# Patient Record
Sex: Female | Born: 1935 | Race: White | Hispanic: No | Smoking: Never smoker
Health system: Southern US, Community
[De-identification: ages and names within clinical notes are randomized; demographics above are authoritative.]

## PROBLEM LIST (undated history)

## (undated) DIAGNOSIS — E782 Mixed hyperlipidemia: Secondary | ICD-10-CM

## (undated) DIAGNOSIS — F339 Major depressive disorder, recurrent, unspecified: Secondary | ICD-10-CM

## (undated) DIAGNOSIS — Z79899 Other long term (current) drug therapy: Secondary | ICD-10-CM

## (undated) DIAGNOSIS — I739 Peripheral vascular disease, unspecified: Secondary | ICD-10-CM

## (undated) DIAGNOSIS — I059 Rheumatic mitral valve disease, unspecified: Secondary | ICD-10-CM

## (undated) DIAGNOSIS — M5136 Other intervertebral disc degeneration, lumbar region: Secondary | ICD-10-CM

## (undated) DIAGNOSIS — R5383 Other fatigue: Secondary | ICD-10-CM

## (undated) DIAGNOSIS — I10 Essential (primary) hypertension: Secondary | ICD-10-CM

## (undated) DIAGNOSIS — F419 Anxiety disorder, unspecified: Secondary | ICD-10-CM

## (undated) DIAGNOSIS — R0600 Dyspnea, unspecified: Secondary | ICD-10-CM

## (undated) DIAGNOSIS — K589 Irritable bowel syndrome without diarrhea: Secondary | ICD-10-CM

## (undated) DIAGNOSIS — K219 Gastro-esophageal reflux disease without esophagitis: Secondary | ICD-10-CM

## (undated) DIAGNOSIS — Z853 Personal history of malignant neoplasm of breast: Secondary | ICD-10-CM

## (undated) DIAGNOSIS — R011 Cardiac murmur, unspecified: Secondary | ICD-10-CM

## (undated) DIAGNOSIS — M8580 Other specified disorders of bone density and structure, unspecified site: Secondary | ICD-10-CM

## (undated) DIAGNOSIS — M479 Spondylosis, unspecified: Secondary | ICD-10-CM

## (undated) DIAGNOSIS — R5381 Other malaise: Secondary | ICD-10-CM

## (undated) DIAGNOSIS — R7303 Prediabetes: Secondary | ICD-10-CM

## (undated) DIAGNOSIS — C50919 Malignant neoplasm of unspecified site of unspecified female breast: Secondary | ICD-10-CM

## (undated) DIAGNOSIS — I6523 Occlusion and stenosis of bilateral carotid arteries: Secondary | ICD-10-CM

## (undated) DIAGNOSIS — M150 Primary generalized (osteo)arthritis: Secondary | ICD-10-CM

## (undated) HISTORY — DX: Anxiety disorder, unspecified: F41.9

## (undated) HISTORY — DX: Occlusion and stenosis of bilateral carotid arteries: I65.23

## (undated) HISTORY — DX: Spondylosis, unspecified: M47.9

## (undated) HISTORY — DX: Prediabetes: R73.03

## (undated) HISTORY — DX: Other long term (current) drug therapy: Z79.899

## (undated) HISTORY — DX: Malignant neoplasm of unspecified site of unspecified female breast: C50.919

## (undated) HISTORY — PX: MASTECTOMY: SHX3

## (undated) HISTORY — DX: Peripheral vascular disease, unspecified: I73.9

## (undated) HISTORY — DX: Irritable bowel syndrome without diarrhea: K58.9

## (undated) HISTORY — DX: Personal history of malignant neoplasm of breast: Z85.3

## (undated) HISTORY — DX: Mixed hyperlipidemia: E78.2

## (undated) HISTORY — DX: Gastro-esophageal reflux disease without esophagitis: K21.9

## (undated) HISTORY — DX: Essential (primary) hypertension: I10

## (undated) HISTORY — DX: Other fatigue: R53.83

## (undated) HISTORY — DX: Other malaise: R53.81

## (undated) HISTORY — DX: Primary generalized (osteo)arthritis: M15.0

## (undated) HISTORY — DX: Rheumatic mitral valve disease, unspecified: I05.9

## (undated) HISTORY — DX: Other specified disorders of bone density and structure, unspecified site: M85.80

## (undated) HISTORY — DX: Other intervertebral disc degeneration, lumbar region: M51.36

## (undated) HISTORY — DX: Major depressive disorder, recurrent, unspecified: F33.9

---

## 2013-08-15 ENCOUNTER — Ambulatory Visit: Payer: Self-pay | Admitting: Ophthalmology

## 2013-08-15 DIAGNOSIS — I10 Essential (primary) hypertension: Secondary | ICD-10-CM

## 2013-08-15 LAB — POTASSIUM: POTASSIUM: 3.4 mmol/L — AB (ref 3.5–5.1)

## 2013-08-23 ENCOUNTER — Ambulatory Visit: Payer: Self-pay | Admitting: Ophthalmology

## 2013-09-08 ENCOUNTER — Ambulatory Visit: Payer: Self-pay | Admitting: Ophthalmology

## 2013-09-08 LAB — POTASSIUM: Potassium: 3.8 mmol/L (ref 3.5–5.1)

## 2013-09-20 ENCOUNTER — Ambulatory Visit: Payer: Self-pay | Admitting: Ophthalmology

## 2014-12-01 NOTE — Op Note (Signed)
PATIENT NAME:  Susan Spencer, Susan Spencer MR#:  [id number] DATE OF BIRTH:  October 04, 1929  DATE OF PROCEDURE:  09/20/2013  PREOPERATIVE DIAGNOSIS:  Senile cataract of the left eye.  POSTOPERATIVE DIAGNOSIS:  Senile cataract of the left eye.  PROCEDURE:  Phacoemulsification with posterior chamber intraocular lens placement of the left eye.   LENS:  ZCT150, 21.0-diopter posterior chamber toric intraocular lens with 1.5 diopters of cylindrical placed at axis 11 degrees.  ULTRASOUND TIME:  20% of 58 seconds.  CDE 11.7  SURGEON:  Italyhad Cris Talavera, MD  ANESTHESIA:  Topical with tetracaine drops and 2% Xylocaine jelly.  COMPLICATIONS:  None.  DESCRIPTION OF PROCEDURE:  The patient was identified in the holding room and transported to the operating suite and placed in upright position.  The eye was marked so that the 0, 90 and 180 degree axis were clearly marked with purple ink.  Xylocaine gel was placed into the eye and then the eye was prepped and draped in the usual sterile ophthalmic fashion.  The operating microscope was placed into position.   A 1 mm clear-corneal paracentesis incision was made at the 1:30 position.  The anterior chamber was filled with Viscoat.  A 2.4 mm near clear corneal incision was then made at the 10:30 position.  A cystotome and capsulorrhexis forceps were then used to make a curvilinear capsulorrhexis.  Hydrodissect and hydrodelineation were then performed using balanced salt solution.   Phacoemulsification was then used in stop and chop fashion to remove the lens, nucleus and epinucleus.  The remaining cortex was aspirated using the irrigation and aspiration handpiece.  Provisc viscoelastic was then placed into the capsular bag to distend it for lens placement.  An axis marker was then dipped in purple ink and used to mark the axis position of 11 degrees for placement of the toric intraocular lens.  The 11 degree axis has been previously determined by the toric lens calculator.   A  ZCT150, 21.0-diopter lens was then injected into the capsular bag.  It was rotated clockwise until the axis marks on the lens were approximately 15 degrees in the counterclockwise direction to the 11 degree axis mark on the cornea.  The viscoelastic was aspirated from the eye using the irrigation aspiration handpiece.  Then, a Sinskey hook through the sideport incision was used to rotate the lens in a clockwise direction until the axis markings of the intraocular lens were lined up with the axis markings on the cornea.  Balanced salt solution was then used to hydrate the wounds.   Miostat was placed into the anterior chamber to constrict the pupil.  0.1 mL of cefuroxime 10 mg/mL were injected into the anterior chamber for a dose of 1 mg of intracameral antibiotic at the completion of the case. The eye was noted to have a physiologic pressure and there was no wound leak noted.  Topical Vigamox drops and erythromycin ointment were applied to the eye.  The patient was taken to the recovery room in stable condition having had no  complications of anesthesia or surgery.    ____________________________ Deirdre Evenerhadwick R. Taylor Levick, MD crb:dp D: 09/20/2013 14:45:06 ET T: 09/20/2013 15:28:27 ET JOB#: [id number]  cc: Deirdre Evenerhadwick R. Amit Meloy, MD, <Dictator> Lockie MolaHADWICK Pricila Bridge MD ELECTRONICALLY SIGNED 09/27/2013 12:12

## 2014-12-01 NOTE — Op Note (Signed)
PATIENT NAME:  Susan Spencer, Susan Spencer MR#:  [id number] DATE OF BIRTH:  [birth year]  DATE OF PROCEDURE:  08/23/2013  PREOPERATIVE DIAGNOSIS:  Senile cataract of the right eye.  POSTOPERATIVE DIAGNOSIS:  Senile cataract of the right eye.  PROCEDURE:  Phacoemulsification with posterior chamber intraocular lens placement of the right eye.   LENS:  ZCT225 21.0-diopter posterior chamber Toric interocular lens with 2.25 diopters of cylindrical power with axis orientation at 169 degrees.  ULTRASOUND TIME:  14 % of 1 minute 49 seconds.  CDE 15.5.  SURGEON:  Italyhad Paris Chiriboga, MD  ANESTHESIA:  Topical with tetracaine drops and 2% Xylocaine jelly.  COMPLICATIONS:  None.  DESCRIPTION OF PROCEDURE:  The patient was identified in the holding room and transported to the operating suite and placed in upright position.  The eye was marked so that the 0, 90 and 180 degree axis were clearly marked with purple ink.  Xylocaine gel was placed into the eye and then the eye was prepped and draped in the usual sterile ophthalmic fashion.  The operating microscope was placed into position.   A 1 mm clear-corneal paracentesis incision was made at the 12 o'clock position.  The anterior chamber was filled with Viscoat.  A 2.4-millimeter near clear corneal incision was then made at the 9 o'clock position.  A cystotome and capsulorrhexis forceps were then used to make a curvilinear capsulorrhexis.  Hydrodissect and hydrodelineation were then performed using balanced salt solution.   Phacoemulsification was then used in stop and chop fashion to remove the lens, nucleus and epinucleus.  The remaining cortex was aspirated using the irrigation and aspiration handpiece.  Provisc viscoelastic was then placed into the capsular bag to distend it for lens placement.  An axis marker was then dipped in purple ink and used to mark the axis position of 169 degrees for placement of the toric intraocular lens.  The 169 degree axis has been previously  determined by the toric lens calculator.   A 21.0-diopter lens was then injected into the capsular bag.  It was rotated clockwise until the axis marks on the lens were approximately 15 degrees in the counterclockwise direction to the 169 degree axis mark on the cornea.  The viscoelastic was aspirated from the eye using the irrigation aspiration handpiece.  Then, a Sinskey hook through the sideport incision was used to rotate the lens in a clockwise direction until the axis markings of the intraocular lens were lined up with the axis markings on the cornea.  Balanced salt solution was then used to hydrate the wounds.   Miostat was placed into the anterior chamber to constrict the pupil.  0.1 mL of cefuroxime 10 mg/mL were injected into the anterior chamber for a dose of 1 mg of intracameral antibiotic at the completion of the case. The eye was noted to have a physiologic pressure and there was no wound leak noted.  Topical Vigamox drops and erythromycin ointment were applied to the eye.  The patient was taken to the recovery room in stable condition having had no  complications of anesthesia or surgery.  ____________________________ Deirdre Evenerhadwick R. Shanece Cochrane, MD crb:sg D: 08/23/2013 12:57:21 ET T: 08/23/2013 13:15:19 ET JOB#: [id number]  cc: Deirdre Evenerhadwick R. Jadakiss Barish, MD, <Dictator> Lockie MolaHADWICK Graylon Amory MD ELECTRONICALLY SIGNED 08/30/2013 14:45

## 2015-06-18 DIAGNOSIS — I110 Hypertensive heart disease with heart failure: Secondary | ICD-10-CM

## 2015-06-18 DIAGNOSIS — I10 Essential (primary) hypertension: Secondary | ICD-10-CM

## 2015-06-18 HISTORY — DX: Hypertensive heart disease with heart failure: I11.0

## 2015-06-18 HISTORY — DX: Essential (primary) hypertension: I10

## 2015-10-28 DIAGNOSIS — K589 Irritable bowel syndrome without diarrhea: Secondary | ICD-10-CM

## 2015-10-28 DIAGNOSIS — K219 Gastro-esophageal reflux disease without esophagitis: Secondary | ICD-10-CM

## 2015-10-28 DIAGNOSIS — I6523 Occlusion and stenosis of bilateral carotid arteries: Secondary | ICD-10-CM

## 2015-10-28 DIAGNOSIS — M5136 Other intervertebral disc degeneration, lumbar region: Secondary | ICD-10-CM

## 2015-10-28 DIAGNOSIS — Z79899 Other long term (current) drug therapy: Secondary | ICD-10-CM

## 2015-10-28 DIAGNOSIS — M159 Polyosteoarthritis, unspecified: Secondary | ICD-10-CM

## 2015-10-28 DIAGNOSIS — F339 Major depressive disorder, recurrent, unspecified: Secondary | ICD-10-CM | POA: Insufficient documentation

## 2015-10-28 DIAGNOSIS — M8580 Other specified disorders of bone density and structure, unspecified site: Secondary | ICD-10-CM

## 2015-10-28 DIAGNOSIS — M8949 Other hypertrophic osteoarthropathy, multiple sites: Secondary | ICD-10-CM

## 2015-10-28 DIAGNOSIS — M479 Spondylosis, unspecified: Secondary | ICD-10-CM

## 2015-10-28 DIAGNOSIS — I059 Rheumatic mitral valve disease, unspecified: Secondary | ICD-10-CM

## 2015-10-28 DIAGNOSIS — M150 Primary generalized (osteo)arthritis: Secondary | ICD-10-CM

## 2015-10-28 DIAGNOSIS — M51369 Other intervertebral disc degeneration, lumbar region without mention of lumbar back pain or lower extremity pain: Secondary | ICD-10-CM

## 2015-10-28 DIAGNOSIS — E782 Mixed hyperlipidemia: Secondary | ICD-10-CM

## 2015-10-28 DIAGNOSIS — F419 Anxiety disorder, unspecified: Secondary | ICD-10-CM | POA: Insufficient documentation

## 2015-10-28 DIAGNOSIS — R7303 Prediabetes: Secondary | ICD-10-CM

## 2015-10-28 HISTORY — DX: Spondylosis, unspecified: M47.9

## 2015-10-28 HISTORY — DX: Polyosteoarthritis, unspecified: M15.9

## 2015-10-28 HISTORY — DX: Mixed hyperlipidemia: E78.2

## 2015-10-28 HISTORY — DX: Occlusion and stenosis of bilateral carotid arteries: I65.23

## 2015-10-28 HISTORY — DX: Other specified disorders of bone density and structure, unspecified site: M85.80

## 2015-10-28 HISTORY — DX: Primary generalized (osteo)arthritis: M15.0

## 2015-10-28 HISTORY — DX: Other intervertebral disc degeneration, lumbar region without mention of lumbar back pain or lower extremity pain: M51.369

## 2015-10-28 HISTORY — DX: Gastro-esophageal reflux disease without esophagitis: K21.9

## 2015-10-28 HISTORY — DX: Major depressive disorder, recurrent, unspecified: F33.9

## 2015-10-28 HISTORY — DX: Other long term (current) drug therapy: Z79.899

## 2015-10-28 HISTORY — DX: Prediabetes: R73.03

## 2015-10-28 HISTORY — DX: Other intervertebral disc degeneration, lumbar region: M51.36

## 2015-10-28 HISTORY — DX: Anxiety disorder, unspecified: F41.9

## 2015-10-28 HISTORY — DX: Other hypertrophic osteoarthropathy, multiple sites: M89.49

## 2015-10-28 HISTORY — DX: Rheumatic mitral valve disease, unspecified: I05.9

## 2015-10-28 HISTORY — DX: Irritable bowel syndrome, unspecified: K58.9

## 2015-10-29 DIAGNOSIS — I739 Peripheral vascular disease, unspecified: Secondary | ICD-10-CM

## 2015-10-29 HISTORY — DX: Peripheral vascular disease, unspecified: I73.9

## 2016-05-05 DIAGNOSIS — Z853 Personal history of malignant neoplasm of breast: Secondary | ICD-10-CM

## 2016-05-05 DIAGNOSIS — R5383 Other fatigue: Secondary | ICD-10-CM

## 2016-05-05 DIAGNOSIS — R5381 Other malaise: Secondary | ICD-10-CM

## 2016-05-05 HISTORY — DX: Personal history of malignant neoplasm of breast: Z85.3

## 2016-05-05 HISTORY — DX: Other malaise: R53.81

## 2017-07-12 ENCOUNTER — Other Ambulatory Visit: Payer: Self-pay

## 2017-07-12 DIAGNOSIS — I6523 Occlusion and stenosis of bilateral carotid arteries: Secondary | ICD-10-CM

## 2017-07-12 HISTORY — DX: Occlusion and stenosis of bilateral carotid arteries: I65.23

## 2017-07-12 MED ORDER — SPIRONOLACTONE-HCTZ 25-25 MG PO TABS: 0.5000 | ORAL_TABLET | Freq: Every day | ORAL | 0 refills | Status: DC

## 2017-08-16 NOTE — Progress Notes (Signed)
Cardiology Office Note:    Date:  08/17/2017   ID:  Susan Spencer, DOB [birth year], MRN [id number]  PCP:  Gordan Payment., MD  Cardiologist:  Norman Herrlich, MD    Referring MD: Gordan Payment., MD    ASSESSMENT:    1. Hypertensive heart disease with heart failure (HCC)   2. Mixed hyperlipidemia    PLAN:    In order of problems listed above:  1. Stable blood pressure at target no evidence of fluid overload continue current medical treatment with combined thiazide and distal diuretic and low-dose beta-blocker.  Recent labs show normal potassium and GFR 2. Stable continue current low intensity statin recent lipids show no evidence of liver toxicity and good LDL outcome.   Next appointment: One year   Medication Adjustments/Labs and Tests Ordered: Current medicines are reviewed at length with the patient today.  Concerns regarding medicines are outlined above.  Orders Placed This Encounter  Procedures  . EKG 12-Lead   No orders of the defined types were placed in this encounter.   Chief Complaint  Patient presents with  . Follow-up  . Hypertension  . Congestive Heart Failure    History of Present Illness:    Susan Spencer is a 84 y.o. female with a hx of CHF, HTN  last seen in December 2017. Compliance with diet, lifestyle and medications: Yes She has done well without shortness of breath edema palpitation chest pain syncope or TIA.  She had a carotid duplex in my previous practice December 2017 I requested a copy of that report.  As she has not had TIA and is on aspirin I have not ordered a repeat duplex at this time Past Medical History:  Diagnosis Date  . Anxiety disorder 10/28/2015  . Carotid occlusion, bilateral 07/12/2017   left distal ICA is tortuous, increased velocity is seen    . Degeneration of lumbar intervertebral disc 10/28/2015  . Depression, major, recurrent (HCC) 10/28/2015  . Essential hypertension 06/18/2015  . GERD (gastroesophageal reflux disease)  10/28/2015  . High risk medication use 10/28/2015  . History of breast cancer 05/05/2016  . IBS (irritable bowel syndrome) 10/28/2015  . Malaise and fatigue 05/05/2016  . Mitral valve disease 10/28/2015   Overview:  Mild mitral regurg.  . Mixed hyperlipidemia 10/28/2015  . Occlusion and stenosis of bilateral carotid arteries 10/28/2015  . Osteopenia 10/28/2015  . Prediabetes 10/28/2015  . Primary osteoarthritis involving multiple joints 10/28/2015  . PVD (peripheral vascular disease) (HCC) 10/29/2015   Overview:  Carotid.  50-69 Lreft 03/2014. [place]  . Spondylosis 10/28/2015    Past Surgical History:  Procedure Laterality Date  . MASTECTOMY      Current Medications: Current Meds  Medication Sig  . aspirin EC 81 MG tablet Take 1 tablet by mouth daily.  . DOCOSAHEXAENOIC ACID PO Take 1,000 mg by mouth daily.  . dorzolamide-timolol (COSOPT) 22.3-6.8 MG/ML ophthalmic solution Place 1 drop into both eyes 2 (two) times daily.  . fluticasone (FLONASE) 50 MCG/ACT nasal spray Place 1-2 sprays into both nostrils daily.  Marland Kitchen LUMIGAN 0.01 % SOLN Place 1 drop into both eyes daily.  . metoprolol succinate (TOPROL-XL) 50 MG 24 hr tablet Take 1 tablet by mouth daily.  . Multiple Vitamin (MULTI-VITAMINS) TABS Take 1 tablet by mouth daily.  . pravastatin (PRAVACHOL) 20 MG tablet Take 1 tablet by mouth daily.  . raloxifene (EVISTA) 60 MG tablet Take 1 tablet by mouth daily.  . ranitidine (ZANTAC) 150 MG capsule Take  150 mg by mouth 2 (two) times daily.  . sertraline (ZOLOFT) 50 MG tablet Take 1 tablet by mouth daily.  Marland Kitchen. spironolactone-hydrochlorothiazide (ALDACTAZIDE) 25-25 MG tablet Take 0.5 tablets by mouth daily.  . traZODone (DESYREL) 50 MG tablet Take 25 mg by mouth as directed.     Allergies:   Patient has no known allergies.   Social History   Socioeconomic History  . Marital status: Married    Spouse name: None  . Number of children: None  . Years of education: None  . Highest education level:  None  Social Needs  . Financial resource strain: None  . Food insecurity - worry: None  . Food insecurity - inability: None  . Transportation needs - medical: None  . Transportation needs - non-medical: None  Occupational History  . None  Tobacco Use  . Smoking status: Never Smoker  . Smokeless tobacco: Never Used  Substance and Sexual Activity  . Alcohol use: No    Frequency: Never  . Drug use: No  . Sexual activity: None  Other Topics Concern  . None  Social History Narrative  . None     Family History: The patient's family history includes Diabetes in her mother. ROS:   Please see the history of present illness.    All other systems reviewed and are negative.  EKGs/Labs/Other Studies Reviewed:    The following studies were reviewed today:  EKG:  EKG ordered today.  The ekg ordered today demonstrates SRTH first degree AVB stable pattern qs in v2  Recent Labs: 05/11/17 CMP normal Cr 0.77 K 4.0 CBC normal  Recent Lipid Panel 05/11/17 Chol 149, LDL 96 HDL 52   Physical Exam:    VS:  BP 132/68   Pulse 68   Ht 5\' 5"  (1.651 m)   Wt 128 lb 6.4 oz (58.2 kg)   SpO2 98%   BMI 21.37 kg/m     Wt Readings from Last 3 Encounters:  08/17/17 128 lb 6.4 oz (58.2 kg)     GEN: looks younger than age Well nourished, well developed in no acute distress HEENT: Normal NECK: No JVD; bilateral soft systolic bruits LYMPHATICS: No lymphadenopathy CARDIAC: RRR, 1/6 SEM aortic area S2 is normal  RESPIRATORY:  Clear to auscultation without rales, wheezing or rhonchi  ABDOMEN: Soft, non-tender, non-distended MUSCULOSKELETAL:  No edema; No deformity  SKIN: Warm and dry NEUROLOGIC:  Alert and oriented x 3 PSYCHIATRIC:  Normal affect    Signed, Norman HerrlichBrian [place], MD  08/17/2017 2:54 PM    [facility]

## 2017-08-17 ENCOUNTER — Ambulatory Visit (INDEPENDENT_AMBULATORY_CARE_PROVIDER_SITE_OTHER): Payer: Medicare Other | Admitting: Cardiology

## 2017-08-17 ENCOUNTER — Encounter: Payer: Self-pay | Admitting: Cardiology

## 2017-08-17 VITALS — BP 132/68 | HR 68 | Ht 65.0 in | Wt 128.4 lb

## 2017-08-17 DIAGNOSIS — E782 Mixed hyperlipidemia: Secondary | ICD-10-CM | POA: Diagnosis not present

## 2017-08-17 DIAGNOSIS — I110 Hypertensive heart disease with heart failure: Secondary | ICD-10-CM

## 2017-08-17 NOTE — Patient Instructions (Signed)
Medication Instructions:  Your physician recommends that you continue on your current medications as directed. Please refer to the Current Medication list given to you today.  Labwork: None  Testing/Procedures: None  Follow-Up: Your physician recommends that you schedule a follow-up appointment in: 1 year  Any Other Special Instructions Will Be Listed Below (If Applicable).     If you need a refill on your cardiac medications before your next appointment, please call your pharmacy.   [facility]  Ashley A, RN, BSN  

## 2017-09-14 ENCOUNTER — Other Ambulatory Visit: Payer: Self-pay

## 2017-09-14 MED ORDER — SPIRONOLACTONE-HCTZ 25-25 MG PO TABS: 0.5000 | ORAL_TABLET | Freq: Every day | ORAL | 11 refills | Status: DC

## 2017-09-20 ENCOUNTER — Other Ambulatory Visit: Payer: Self-pay

## 2017-09-20 MED ORDER — PRAVASTATIN SODIUM 20 MG PO TABS: 20.0000 mg | ORAL_TABLET | Freq: Every day | ORAL | 3 refills | Status: DC

## 2017-11-05 ENCOUNTER — Ambulatory Visit: Payer: Medicare Other | Admitting: Podiatry

## 2017-11-19 ENCOUNTER — Encounter: Payer: Self-pay | Admitting: Podiatry

## 2017-11-19 ENCOUNTER — Ambulatory Visit (INDEPENDENT_AMBULATORY_CARE_PROVIDER_SITE_OTHER): Payer: Medicare Other | Admitting: Podiatry

## 2017-11-19 ENCOUNTER — Ambulatory Visit (INDEPENDENT_AMBULATORY_CARE_PROVIDER_SITE_OTHER): Payer: Medicare Other

## 2017-11-19 VITALS — BP 136/68 | HR 67 | Resp 16

## 2017-11-19 DIAGNOSIS — M2041 Other hammer toe(s) (acquired), right foot: Secondary | ICD-10-CM

## 2017-11-19 DIAGNOSIS — M2042 Other hammer toe(s) (acquired), left foot: Secondary | ICD-10-CM

## 2017-11-19 DIAGNOSIS — M722 Plantar fascial fibromatosis: Secondary | ICD-10-CM | POA: Diagnosis not present

## 2017-11-21 NOTE — Progress Notes (Signed)
Subjective:   Patient ID: Susan HeroSara S Magill, female   DOB: 84 y.o.   MRN: [id number]   HPI Patient presents stating she is concerned about her second digit right being up in the air and moderately painful and she is trying shoe gear modifications which have been somewhat helpful.  Also states the arch hurts her at times patient currently does not smoke and likes to be active   Review of Systems  All other systems reviewed and are negative.       Objective:  Physical Exam  Constitutional: She appears well-developed and well-nourished.  Cardiovascular: Intact distal pulses.  Pulmonary/Chest: Effort normal.  Musculoskeletal: Normal range of motion.  Neurological: She is alert.  Skin: Skin is warm.  Nursing note and vitals reviewed.   Neurovascular status found to be intact muscle strength is adequate with patient noted to have moderate diminishment of sharp dull vibratory.  Patient has significant elevation second digit right and it is tender on top moderate in nature with shoe gear that has been accommodating the area     Assessment:  Digital hammertoe deformity with also moderate distal plantar fasciitis noted right with rigid contracture digit to     Plan:  H&P condition reviewed and applied padding to the second toe and discussed possible tenotomy or digital fusion if symptoms persist.  Reviewed x-rays with patient  X-rays indicate significant elevation second digit right with moderate to severe osteoporosis

## 2018-09-15 ENCOUNTER — Telehealth: Payer: Self-pay | Admitting: Cardiology

## 2018-09-15 ENCOUNTER — Other Ambulatory Visit: Payer: Self-pay

## 2018-09-15 ENCOUNTER — Other Ambulatory Visit: Payer: Self-pay | Admitting: Cardiology

## 2018-09-15 MED ORDER — SPIRONOLACTONE-HCTZ 25-25 MG PO TABS: 0.5000 | ORAL_TABLET | Freq: Every day | ORAL | 1 refills | Status: DC

## 2018-09-15 NOTE — Telephone Encounter (Signed)
Call [facility] to [facility] [address]

## 2018-09-15 NOTE — Telephone Encounter (Signed)
Rx sent to pharmacy as requested.

## 2018-10-09 ENCOUNTER — Other Ambulatory Visit: Payer: Self-pay | Admitting: Cardiology

## 2018-11-06 ENCOUNTER — Other Ambulatory Visit: Payer: Self-pay | Admitting: Cardiology

## 2018-11-29 ENCOUNTER — Other Ambulatory Visit: Payer: Self-pay | Admitting: Cardiology

## 2018-11-30 NOTE — Telephone Encounter (Signed)
Spironolactone Hctz sent to [facility] on [address]

## 2019-02-27 ENCOUNTER — Other Ambulatory Visit: Payer: Self-pay | Admitting: Cardiology

## 2019-03-25 ENCOUNTER — Other Ambulatory Visit: Payer: Self-pay | Admitting: Cardiology

## 2019-04-19 NOTE — Progress Notes (Signed)
Cardiology Office Note:    Date:  04/20/2019   ID:  Susan Spencer, DOB [birth year], MRN [id number]  PCP:  Raina Mina., MD  Cardiologist:  Shirlee More, MD    Referring MD: Raina Mina., MD    ASSESSMENT:    1. Hypertensive heart disease with heart failure ([place])   2. Mixed hyperlipidemia   3. Carotid occlusion, bilateral    PLAN:    In order of problems listed above:  1. Stable her mean blood pressures at target continue treatment including beta-blocker and non-loop diuretic combination of hydrochlorothiazide and spironolactone.  With her diastolic less than 60 I would not intensify treatment. 2. Stable continue a statin with her carotid disease 3. She is asymptomatic no bruit continue aspirin antihypertensive statin I do not feel compelled to repeat a duplex at this time   Next appointment: 1 year   Medication Adjustments/Labs and Tests Ordered: Current medicines are reviewed at length with the patient today.  Concerns regarding medicines are outlined above.  No orders of the defined types were placed in this encounter.  No orders of the defined types were placed in this encounter.   Chief Complaint  Patient presents with  . Follow-up  . Hypertension  . Congestive Heart Failure    History of Present Illness:    Susan Spencer is a 85 y.o. female with a hx of hypertension and heart failure last seen 08/17/2017. Compliance with diet, lifestyle and medications: Yes  Life is difficult for her with COVID-19 and isolation.  Home blood pressure runs in the range of 130/60.  Initial blood pressure office was difficult to determine sitting resting 156/56 and with her diastolic this low I would not lower her mean arterial pressure any further with additional antihypertensive agents.  She has had no chest pain shortness of breath palpitation or syncope.  Recent labs including lipids that are at target reviewed with the patient Past Medical History:  Diagnosis Date  . Anxiety  disorder 10/28/2015  . Carotid occlusion, bilateral 07/12/2017   left distal ICA is tortuous, increased velocity is seen    . Degeneration of lumbar intervertebral disc 10/28/2015  . Depression, major, recurrent ([place]) 10/28/2015  . Essential hypertension 06/18/2015  . GERD (gastroesophageal reflux disease) 10/28/2015  . High risk medication use 10/28/2015  . History of breast cancer 05/05/2016  . IBS (irritable bowel syndrome) 10/28/2015  . Malaise and fatigue 05/05/2016  . Mitral valve disease 10/28/2015   Overview:  Mild mitral regurg.  . Mixed hyperlipidemia 10/28/2015  . Occlusion and stenosis of bilateral carotid arteries 10/28/2015  . Osteopenia 10/28/2015  . Prediabetes 10/28/2015  . Primary osteoarthritis involving multiple joints 10/28/2015  . PVD (peripheral vascular disease) ([place]) 10/29/2015   Overview:  Carotid.  50-69 Lreft 03/2014.   . Spondylosis 10/28/2015    Past Surgical History:  Procedure Laterality Date  . MASTECTOMY      Current Medications: Current Meds  Medication Sig  . aspirin EC 81 MG tablet Take 1 tablet by mouth daily.  . calcium-vitamin D (OSCAL WITH D) 500-200 MG-UNIT tablet Take 1 tablet by mouth 2 (two) times daily.   . dorzolamide-timolol (COSOPT) 22.3-6.8 MG/ML ophthalmic solution Place 1 drop into both eyes 2 (two) times daily.  . famotidine (PEPCID) 40 MG tablet Take 0.5 tablets by mouth daily as needed.  . fluticasone (FLONASE) 50 MCG/ACT nasal spray Place 1-2 sprays into both nostrils daily.  Marland Kitchen loratadine (CLARITIN) 10 MG tablet Take 10 mg by  mouth daily as needed for allergies.  Marland Kitchen LUMIGAN 0.01 % SOLN Place 1 drop into both eyes daily.  . metoprolol succinate (TOPROL-XL) 50 MG 24 hr tablet Take 1 tablet by mouth daily.  . Multiple Vitamin (MULTI-VITAMINS) TABS Take 1 tablet by mouth daily.  . Omega-3 Fatty Acids (FISH OIL PO) Take 1,000 mg by mouth daily.   . pravastatin (PRAVACHOL) 20 MG tablet Take 20 mg by mouth every other day.  . Probiotic  Product (PROBIOTIC DAILY PO) Take 1 tablet by mouth daily.   . raloxifene (EVISTA) 60 MG tablet Take 1 tablet by mouth daily.  . sertraline (ZOLOFT) 50 MG tablet Take 1 tablet by mouth daily.  Marland Kitchen spironolactone-hydrochlorothiazide (ALDACTAZIDE) 25-25 MG tablet TAKE 1/2 TABLET BY MOUTH DAILY. NEED OFFICE VISIT FOR MORE REFILLS  . traZODone (DESYREL) 50 MG tablet Take 25 mg by mouth as directed.     Allergies:   Patient has no known allergies.   Social History   Socioeconomic History  . Marital status: Married    Spouse name: Not on file  . Number of children: Not on file  . Years of education: Not on file  . Highest education level: Not on file  Occupational History  . Not on file  Social Needs  . Financial resource strain: Not on file  . Food insecurity    Worry: Not on file    Inability: Not on file  . Transportation needs    Medical: Not on file    Non-medical: Not on file  Tobacco Use  . Smoking status: Never Smoker  . Smokeless tobacco: Never Used  Substance and Sexual Activity  . Alcohol use: No    Frequency: Never  . Drug use: No  . Sexual activity: Not on file  Lifestyle  . Physical activity    Days per week: Not on file    Minutes per session: Not on file  . Stress: Not on file  Relationships  . Social Musician on phone: Not on file    Gets together: Not on file    Attends religious service: Not on file    Active member of club or organization: Not on file    Attends meetings of clubs or organizations: Not on file    Relationship status: Not on file  Other Topics Concern  . Not on file  Social History Narrative  . Not on file     Family History: The patient's family history includes Diabetes in her mother. ROS:   Please see the history of present illness.    All other systems reviewed and are negative.  EKGs/Labs/Other Studies Reviewed:    The following studies were reviewed today:  EKG:  EKG ordered today and personally reviewed.   The ekg ordered today demonstrates sinus rhythm nonspecific ST abnormality QS in V2 consider old anterior septal MI  Recent Labs: 01/17/2019: Cr 0.78 K 4.2 AST ALT normal Chol 150 HDL 52 LDL 90  Physical Exam:    VS:  Temp (!) 97.5 F (36.4 C)   Ht 5' 4.5" (1.638 m)   Wt 121 lb (54.9 kg)   BMI 20.45 kg/m     Wt Readings from Last 3 Encounters:  04/20/19 121 lb (54.9 kg)  08/17/17 128 lb 6.4 oz (58.2 kg)     GEN: She appears frail well nourished, well developed in no acute distress HEENT: Normal NECK: No JVD; No carotid bruits LYMPHATICS: No lymphadenopathy CARDIAC: Soft 1/6 midsystolic ejection  murmur aortic area no aortic regurgitation RRR, no murmurs, rubs, gallops RESPIRATORY:  Clear to auscultation without rales, wheezing or rhonchi  ABDOMEN: Soft, non-tender, non-distended MUSCULOSKELETAL:  No edema; No deformity  SKIN: Warm and dry NEUROLOGIC:  Alert and oriented x 3 PSYCHIATRIC:  Normal affect    Signed, Norman HerrlichBrian , MD  04/20/2019 1:29 PM    [facility]

## 2019-04-20 ENCOUNTER — Other Ambulatory Visit: Payer: Self-pay

## 2019-04-20 ENCOUNTER — Encounter: Payer: Self-pay | Admitting: Cardiology

## 2019-04-20 ENCOUNTER — Ambulatory Visit (INDEPENDENT_AMBULATORY_CARE_PROVIDER_SITE_OTHER): Payer: Medicare Other | Admitting: Cardiology

## 2019-04-20 ENCOUNTER — Other Ambulatory Visit: Payer: Self-pay | Admitting: Cardiology

## 2019-04-20 VITALS — BP 156/56 | HR 70 | Temp 97.5°F | Ht 64.5 in | Wt 121.0 lb

## 2019-04-20 DIAGNOSIS — I6523 Occlusion and stenosis of bilateral carotid arteries: Secondary | ICD-10-CM | POA: Diagnosis not present

## 2019-04-20 DIAGNOSIS — E782 Mixed hyperlipidemia: Secondary | ICD-10-CM

## 2019-04-20 DIAGNOSIS — I110 Hypertensive heart disease with heart failure: Secondary | ICD-10-CM

## 2019-04-20 MED ORDER — SPIRONOLACTONE-HCTZ 25-25 MG PO TABS: ORAL_TABLET | ORAL | 3 refills | Status: DC

## 2019-04-20 MED ORDER — PRAVASTATIN SODIUM 20 MG PO TABS: 20.0000 mg | ORAL_TABLET | ORAL | 3 refills | Status: DC

## 2019-04-20 NOTE — Patient Instructions (Signed)

## 2019-12-27 ENCOUNTER — Ambulatory Visit (INDEPENDENT_AMBULATORY_CARE_PROVIDER_SITE_OTHER): Payer: Medicare Other

## 2019-12-27 ENCOUNTER — Other Ambulatory Visit: Payer: Self-pay

## 2019-12-27 ENCOUNTER — Ambulatory Visit (INDEPENDENT_AMBULATORY_CARE_PROVIDER_SITE_OTHER): Payer: Medicare Other | Admitting: Orthopaedic Surgery

## 2019-12-27 ENCOUNTER — Encounter: Payer: Self-pay | Admitting: Orthopaedic Surgery

## 2019-12-27 DIAGNOSIS — M25552 Pain in left hip: Secondary | ICD-10-CM | POA: Diagnosis not present

## 2019-12-27 DIAGNOSIS — M1612 Unilateral primary osteoarthritis, left hip: Secondary | ICD-10-CM

## 2019-12-27 HISTORY — DX: Unilateral primary osteoarthritis, left hip: M16.12

## 2019-12-27 NOTE — Progress Notes (Signed)
Office Visit Note   Patient: Susan Spencer           Date of Birth: 1929/10/08           MRN: [id number] Visit Date: 12/27/2019              Requested by: Raina Mina., MD [place],  [place] [address] PCP: Raina Mina., MD   Assessment & Plan: Visit Diagnoses:  1. Pain in left hip   2. Unilateral primary osteoarthritis, left hip     Plan: Given the severity of her left hip osteoarthritis and given the failed conservative treatment we are recommending a hip replacement this standpoint.  Her x-ray findings are worrisome for developing some osteonecrosis of the hip as well having had multiple steroid injections.  I showed her hip model and gave her handout about hip replacement surgery.  I described in detail what the surgery involves including the interoperative and postoperative aspects of surgery.  I explained in detail the risk and benefits of surgery as well.  She is hoping to stay in skilled nursing after surgery in the [place] area where she is from.  She apparently lives in a independent living area that is associated with the skilled nursing section.  All questions and concerns were answered and addressed.  We will work on getting the surgery scheduled in the near future.  Follow-Up Instructions: Return for 2 weeks post-op.   Orders:  Orders Placed This Encounter  Procedures  . XR HIP UNILAT W OR W/O PELVIS 1V LEFT   No orders of the defined types were placed in this encounter.     Procedures: No procedures performed   Clinical Data: No additional findings.   Subjective: Chief Complaint  Patient presents with  . Left Hip - Pain  Patient comes in today as a new patient for evaluation treatment of worsening arthritis in her left hip.  This is been getting worse for last 3 years.  She has had at least 4 steroid injections in that left hip.  She does ambulate with a cane.  Its been painful to walk.  She has been told she has bone-on-bone arthritis.   Her son is with her today.  At this point with the very conservative treatment and her hip pain being 10 out of 10 on a daily basis, they do wish to proceed with total hip arthroplasty surgery and talk about that today.  She does not have any significant medical problems other than some carotid stenosis.  She does take a baby aspirin daily.  She does not smoke and is not a diabetic.  At this point her left hip pain is detrimentally affecting her mobility, her quality of life, and her activities day living.  She did lose her husband to bladder cancer in April.  She feels like it is time for her to take care of her own self now and not hurt which she is hurting quite a bit due to her left hip pain.  Her son agrees with this as well  HPI  Review of Systems .  She currently denies any headache, chest pain, shortness of breath, fever, chills, nausea, vomiting  Objective: Vital Signs: There were no vitals taken for this visit.  Physical Exam She is alert and orient x3 and in no acute distress Ortho Exam Examination of her left hip shows severe pain with any attempts of internal and external rotation.  There is also limitations in  motion.  Her right hip exam is normal. Specialty Comments:  No specialty comments available.  Imaging: XR HIP UNILAT W OR W/O PELVIS 1V LEFT  Result Date: 12/27/2019 An AP pelvis and lateral left hip show severe end-stage arthritis left hip.  There is complete loss of the joint space.  There is sclerotic changes in the femoral head as well as periarticular osteophytes.  The femoral head is irregular as well.    PMFS History: Patient Active Problem List   Diagnosis Date Noted  . Unilateral primary osteoarthritis, left hip 12/27/2019  . Carotid occlusion, bilateral 07/12/2017  . History of breast cancer 05/05/2016  . Malaise and fatigue 05/05/2016  . PVD (peripheral vascular disease) (HCC) 10/29/2015  . Anxiety disorder 10/28/2015  . Degeneration of lumbar  intervertebral disc 10/28/2015  . Depression, major, recurrent (HCC) 10/28/2015  . GERD (gastroesophageal reflux disease) 10/28/2015  . High risk medication use 10/28/2015  . Mixed hyperlipidemia 10/28/2015  . IBS (irritable bowel syndrome) 10/28/2015  . Mitral valve disease 10/28/2015  . Occlusion and stenosis of bilateral carotid arteries 10/28/2015  . Osteopenia 10/28/2015  . Prediabetes 10/28/2015  . Primary osteoarthritis involving multiple joints 10/28/2015  . Spondylosis 10/28/2015  . Hypertensive heart disease with heart failure (HCC) 06/18/2015   Past Medical History:  Diagnosis Date  . Anxiety disorder 10/28/2015  . Carotid occlusion, bilateral 07/12/2017   left distal ICA is tortuous, increased velocity is seen    . Degeneration of lumbar intervertebral disc 10/28/2015  . Depression, major, recurrent (HCC) 10/28/2015  . Essential hypertension 06/18/2015  . GERD (gastroesophageal reflux disease) 10/28/2015  . High risk medication use 10/28/2015  . History of breast cancer 05/05/2016  . IBS (irritable bowel syndrome) 10/28/2015  . Malaise and fatigue 05/05/2016  . Mitral valve disease 10/28/2015   Overview:  Mild mitral regurg.  . Mixed hyperlipidemia 10/28/2015  . Occlusion and stenosis of bilateral carotid arteries 10/28/2015  . Osteopenia 10/28/2015  . Prediabetes 10/28/2015  . Primary osteoarthritis involving multiple joints 10/28/2015  . PVD (peripheral vascular disease) (HCC) 10/29/2015   Overview:  Carotid.  50-69 Lreft 03/2014. [place]  . Spondylosis 10/28/2015    Family History  Problem Relation Age of Onset  . Diabetes Mother     Past Surgical History:  Procedure Laterality Date  . MASTECTOMY     Social History   Occupational History  . Not on file  Tobacco Use  . Smoking status: Never Smoker  . Smokeless tobacco: Never Used  Substance and Sexual Activity  . Alcohol use: No  . Drug use: No  . Sexual activity: Not on file

## 2019-12-29 ENCOUNTER — Other Ambulatory Visit: Payer: Self-pay

## 2020-01-24 ENCOUNTER — Other Ambulatory Visit: Payer: Self-pay | Admitting: Family

## 2020-01-24 NOTE — Patient Instructions (Addendum)
DUE TO COVID-19 ONLY ONE VISITOR IS ALLOWED TO COME WITH YOU AND STAY IN THE WAITING ROOM ONLY DURING PRE OP AND PROCEDURE DAY OF SURGERY. THE 2 VISITORS  MAY VISIT WITH YOU AFTER SURGERY IN YOUR PRIVATE ROOM DURING VISITING HOURS ONLY!  YOU NEED TO HAVE A COVID 19 TEST ON__6/22_____ @_2 :15______, THIS TEST MUST BE DONE BEFORE SURGERY, COME  [address], Susan Spencer , .  [facility]) ONCE YOUR COVID TEST IS COMPLETED, PLEASE BEGIN THE QUARANTINE INSTRUCTIONS AS OUTLINED IN YOUR HANDOUT.                Susan Spencer   Your procedure is scheduled on: 02/02/20   Report to [facility] Main  Entrance   Report to admitting at  6:00 AM     Call this number if you have problems the morning of surgery [phone]    BRUSH YOUR TEETH MORNING OF SURGERY AND RINSE YOUR MOUTH OUT, NO CHEWING GUM CANDY OR MINTS.   Do not eat food After Midnight.   YOU MAY HAVE CLEAR LIQUIDS FROM MIDNIGHT UNTIL 4:30 AM.   At 4:30 AM Please finish the prescribed Pre-Surgery Gatorade drink.   Nothing by mouth after you finish the Gatorade drink !   Take these medicines the morning of surgery with A SIP OF WATER: Metoprolol, Zoloft,                                 You may not have any metal on your body including hair pins and              piercings  Do not wear jewelry, make-up, lotions, powders or perfumes, deodorant             Do not wear nail polish on your fingernails.             Do not shave  48 hours prior to surgery.     Do not bring valuables to the hospital. [facility] IS NOT             RESPONSIBLE   FOR VALUABLES.  Contacts, dentures or bridgework may not be worn into surgery.  .               Please read over the following fact sheets you were given: _____________________________________________________________________             [facility] - Preparing for Surgery Before surgery, you can play an important role.   Because skin is not sterile, your  skin needs to be as free of germs as possible.   You can reduce the number of germs on your skin by washing with CHG (chlorahexidine gluconate) soap before surgery.   CHG is an antiseptic cleaner which kills germs and bonds with the skin to continue killing germs even after washing. Please DO NOT use if you have an allergy to CHG or antibacterial soaps.   If your skin becomes reddened/irritated stop using the CHG and inform your nurse when you arrive at Short Stay. Do not shave (including legs and underarms) for at least 48 hours prior to the first CHG shower.  . Please follow these instructions carefully:   1.  Shower with CHG Soap the night before surgery and the  morning of Surgery.   2.  If you choose to wash your hair, wash your hair first as usual with your  normal  Shampoo.   3.  After you shampoo, rinse your hair and body thoroughly to remove the  shampoo.                                         4.  Use CHG as you would any other liquid soap.  You can apply chg directly  to the skin and wash                       Gently with a scrungie or clean washcloth.   5.  Apply the CHG Soap to your body ONLY FROM THE NECK DOWN.   Do not use on face/ open                           Wound or open sores. Avoid contact with eyes, ears mouth and genitals (private parts).                       Wash face,  Genitals (private parts) with your normal soap.              6.  Wash thoroughly, paying special attention to the area where your surgery  will be performed.   7.  Thoroughly rinse your body with warm water from the neck down.   8.  DO NOT shower/wash with your normal soap after using and rinsing off  the CHG Soap.              9.  Pat yourself dry with a clean towel.             10.  Wear clean pajamas.             11.  Place clean sheets on your bed the night of your first shower and do not  sleep with pets.  Day of Surgery : Do not apply any lotions/deodorants the morning of surgery.   Please wear clean clothes to the hospital/surgery center.   FAILURE TO FOLLOW THESE INSTRUCTIONS MAY RESULT IN THE CANCELLATION OF YOUR SURGERY PATIENT SIGNATURE_________________________________  NURSE SIGNATURE__________________________________  ________________________________________________________________________   Susan Spencer  An incentive spirometer is a tool that can help keep your lungs clear and active. This tool measures how well you are filling your lungs with each breath. Taking long deep breaths may help reverse or decrease the chance of developing breathing (pulmonary) problems (especially infection) following:  A long period of time when you are unable to move or be active. BEFORE THE PROCEDURE   If the spirometer includes an indicator to show your best effort, your nurse or respiratory therapist will set it to a desired goal.  If possible, sit up straight or lean slightly forward. Try not to slouch.  Hold the incentive spirometer in an upright position. INSTRUCTIONS FOR USE  1. Sit on the edge of your bed if possible, or sit up as far as you can in bed or on a chair. 2. Hold the incentive spirometer in an upright position. 3. Breathe out normally. 4. Place the mouthpiece in your mouth and seal your lips tightly around it. 5. Breathe in slowly and as deeply as possible, raising the piston or the ball toward the top of the column. 6. Hold your breath for 3-5 seconds or for as long as possible. Allow  the piston or ball to fall to the bottom of the column. 7. Remove the mouthpiece from your mouth and breathe out normally. 8. Rest for a few seconds and repeat Steps 1 through 7 at least 10 times every 1-2 hours when you are awake. Take your time and take a few normal breaths between deep breaths. 9. The spirometer may include an indicator to show your best effort. Use the indicator as a goal to work toward during each repetition. 10. After each set of 10 deep  breaths, practice coughing to be sure your lungs are clear. If you have an incision (the cut made at the time of surgery), support your incision when coughing by placing a pillow or rolled up towels firmly against it. Once you are able to get out of bed, walk around indoors and cough well. You may stop using the incentive spirometer when instructed by your caregiver.  RISKS AND COMPLICATIONS  Take your time so you do not get dizzy or light-headed.  If you are in pain, you may need to take or ask for pain medication before doing incentive spirometry. It is harder to take a deep breath if you are having pain. AFTER USE  Rest and breathe slowly and easily.  It can be helpful to keep track of a log of your progress. Your caregiver can provide you with a simple table to help with this. If you are using the spirometer at home, follow these instructions: SEEK MEDICAL CARE IF:   You are having difficultly using the spirometer.  You have trouble using the spirometer as often as instructed.  Your pain medication is not giving enough relief while using the spirometer.  You develop fever of 100.5 F (38.1 C) or higher. SEEK IMMEDIATE MEDICAL CARE IF:   You cough up bloody sputum that had not been present before.  You develop fever of 102 F (38.9 C) or greater.  You develop worsening pain at or near the incision site. MAKE SURE YOU:   Understand these instructions.  Will watch your condition.  Will get help right away if you are not doing well or get worse. Document Released: 12/07/2006 Document Revised: 10/19/2011 Document Reviewed: 02/07/2007 [facility] Patient Information 2014 [place], [place].

## 2020-01-25 ENCOUNTER — Encounter (HOSPITAL_COMMUNITY): Payer: Self-pay | Admitting: *Deleted

## 2020-01-25 ENCOUNTER — Other Ambulatory Visit: Payer: Self-pay

## 2020-01-25 ENCOUNTER — Encounter (HOSPITAL_COMMUNITY)
Admission: RE | Admit: 2020-01-25 | Discharge: 2020-01-25 | Disposition: A | Discharge status: Home / Self Care | Payer: Medicare Other | Source: Ambulatory Visit | Attending: Orthopaedic Surgery | Admitting: Orthopaedic Surgery

## 2020-01-25 DIAGNOSIS — Z01818 Encounter for other preprocedural examination: Secondary | ICD-10-CM | POA: Insufficient documentation

## 2020-01-25 HISTORY — DX: Cardiac murmur, unspecified: R01.1

## 2020-01-25 HISTORY — DX: Dyspnea, unspecified: R06.00

## 2020-01-25 NOTE — Progress Notes (Signed)
COVID Vaccine Completed:yes Date COVID Vaccine completed:09/16/19 COVID vaccine manufacturer: Pfizer     PCP - Dr. Rosalita Levan Cardiologist - Dr. Caryl Pina  Chest x-ray - 03/13/19 EKG - 01/26/20 Stress Test - no ECHO - no Cardiac Cath - no  Sleep Study - no CPAP -   Fasting Blood Sugar - NA Checks Blood Sugar _____ times a day  Blood Thinner Instructions:ASA Aspirin Instructions:Pt didn't receive any instructions from Dr. Magnus Ivan. I told her to call him and ask about stopping ASA and supplements. Last Dose:  Anesthesia review:   Patient denies shortness of breath, fever, cough and chest pain at PAT appointment yes   Patient verbalized understanding of instructions that were given to them at the PAT appointment. Patient was also instructed that they will need to review over the PAT instructions again at home before surgery. Yes

## 2020-01-26 ENCOUNTER — Encounter (HOSPITAL_COMMUNITY)
Admission: RE | Admit: 2020-01-26 | Discharge: 2020-01-26 | Disposition: A | Discharge status: Home / Self Care | Payer: Medicare Other | Source: Ambulatory Visit | Attending: Orthopaedic Surgery | Admitting: Orthopaedic Surgery

## 2020-01-26 DIAGNOSIS — Z01818 Encounter for other preprocedural examination: Secondary | ICD-10-CM | POA: Diagnosis not present

## 2020-01-26 LAB — SURGICAL PCR SCREEN
MRSA, PCR: NEGATIVE
Staphylococcus aureus: NEGATIVE

## 2020-01-26 LAB — ABO/RH: ABO/RH(D): B POS

## 2020-01-29 ENCOUNTER — Telehealth: Payer: Self-pay | Admitting: Orthopaedic Surgery

## 2020-01-29 NOTE — Telephone Encounter (Signed)
Patient called. She would like to know what if any medication she should stop taking before surgery. Her call back number is [phone]

## 2020-01-29 NOTE — Telephone Encounter (Signed)
Susan Spencer spoke with her already

## 2020-01-30 ENCOUNTER — Other Ambulatory Visit (HOSPITAL_COMMUNITY)
Admission: RE | Admit: 2020-01-30 | Discharge: 2020-01-30 | Disposition: A | Discharge status: Home / Self Care | Payer: Medicare Other | Source: Ambulatory Visit | Attending: Orthopaedic Surgery | Admitting: Orthopaedic Surgery

## 2020-01-30 DIAGNOSIS — Z20822 Contact with and (suspected) exposure to covid-19: Secondary | ICD-10-CM | POA: Insufficient documentation

## 2020-01-30 DIAGNOSIS — Z01812 Encounter for preprocedural laboratory examination: Secondary | ICD-10-CM | POA: Diagnosis present

## 2020-01-30 LAB — SARS CORONAVIRUS 2 (TAT 6-24 HRS): SARS Coronavirus 2: NEGATIVE

## 2020-02-01 NOTE — Anesthesia Preprocedure Evaluation (Addendum)
Anesthesia Evaluation  Patient identified by MRN, date of birth, ID band Patient awake    Reviewed: Allergy & Precautions, NPO status , Patient's Chart, lab work & pertinent test results  History of Anesthesia Complications Negative for: history of anesthetic complications  Airway Mallampati: II  TM Distance: >3 FB Neck ROM: Full    Dental   Pulmonary neg pulmonary ROS,    Pulmonary exam normal        Cardiovascular hypertension, Pt. on home beta blockers and Pt. on medications + Peripheral Vascular Disease and +CHF  Normal cardiovascular exam     Neuro/Psych Anxiety Depression negative neurological ROS     GI/Hepatic Neg liver ROS, GERD  ,  Endo/Other  negative endocrine ROS  Renal/GU negative Renal ROS  negative genitourinary   Musculoskeletal  (+) Arthritis , Osteoarthritis,    Abdominal   Peds  Hematology negative hematology ROS (+)   Anesthesia Other Findings  b/l carotid stenosis, HTN, CHF, PVD, GERD Plts 379 (on chart)  Reproductive/Obstetrics                            Anesthesia Physical Anesthesia Plan  ASA: III  Anesthesia Plan: Spinal   Post-op Pain Management:    Induction:   PONV Risk Score and Plan: Propofol infusion, Treatment may vary due to age or medical condition, Ondansetron and TIVA  Airway Management Planned: Nasal Cannula and Simple Face Mask  Additional Equipment: None  Intra-op Plan:   Post-operative Plan:   Informed Consent: I have reviewed the patients History and Physical, chart, labs and discussed the procedure including the risks, benefits and alternatives for the proposed anesthesia with the patient or authorized representative who has indicated his/her understanding and acceptance.       Plan Discussed with:   Anesthesia Plan Comments:        Anesthesia Quick Evaluation

## 2020-02-01 NOTE — H&P (Signed)
TOTAL HIP ADMISSION H&P  Patient is admitted for left total hip arthroplasty.  Subjective:  Chief Complaint: left hip pain  HPI: Susan Spencer, 86 y.o. female, has a history of pain and functional disability in the left hip(s) due to arthritis and patient has failed non-surgical conservative treatments for greater than 12 weeks to include NSAID's and/or analgesics, corticosteriod injections, flexibility and strengthening excercises, supervised PT with diminished ADL's post treatment, use of assistive devices and activity modification.  Onset of symptoms was gradual starting 3 years ago with gradually worsening course since that time.The patient noted no past surgery on the left hip(s).  Patient currently rates pain in the left hip at 10 out of 10 with activity. Patient has night pain, worsening of pain with activity and weight bearing, trendelenberg gait, pain that interfers with activities of daily living and pain with passive range of motion. Patient has evidence of subchondral cysts, subchondral sclerosis, periarticular osteophytes and joint space narrowing by imaging studies. This condition presents safety issues increasing the risk of falls.  There is no current active infection.  Patient Active Problem List   Diagnosis Date Noted  . Unilateral primary osteoarthritis, left hip 12/27/2019  . Carotid occlusion, bilateral 07/12/2017  . History of breast cancer 05/05/2016  . Malaise and fatigue 05/05/2016  . PVD (peripheral vascular disease) ([place]) 10/29/2015  . Anxiety disorder 10/28/2015  . Degeneration of lumbar intervertebral disc 10/28/2015  . Depression, major, recurrent ([place]) 10/28/2015  . GERD (gastroesophageal reflux disease) 10/28/2015  . High risk medication use 10/28/2015  . Mixed hyperlipidemia 10/28/2015  . IBS (irritable bowel syndrome) 10/28/2015  . Mitral valve disease 10/28/2015  . Occlusion and stenosis of bilateral carotid arteries 10/28/2015  . Osteopenia 10/28/2015  .  Prediabetes 10/28/2015  . Primary osteoarthritis involving multiple joints 10/28/2015  . Spondylosis 10/28/2015  . Hypertensive heart disease with heart failure ([place]) 06/18/2015   Past Medical History:  Diagnosis Date  . Anxiety disorder 10/28/2015  . Carotid occlusion, bilateral 07/12/2017   left distal ICA is tortuous, increased velocity is seen    . Degeneration of lumbar intervertebral disc 10/28/2015  . Depression, major, recurrent ([place]) 10/28/2015  . Dyspnea    anxiety  . Essential hypertension 06/18/2015  . GERD (gastroesophageal reflux disease) 10/28/2015  . Heart murmur   . High risk medication use 10/28/2015  . History of breast cancer 05/05/2016  . IBS (irritable bowel syndrome) 10/28/2015  . Malaise and fatigue 05/05/2016  . Mitral valve disease 10/28/2015   Overview:  Mild mitral regurg.  . Mixed hyperlipidemia 10/28/2015  . Occlusion and stenosis of bilateral carotid arteries 10/28/2015  . Osteopenia 10/28/2015  . Prediabetes 10/28/2015  . Primary osteoarthritis involving multiple joints 10/28/2015  . PVD (peripheral vascular disease) ([place]) 10/29/2015   Overview:  Carotid.  50-69 Lreft 03/2014. [place]  . Spondylosis 10/28/2015    Past Surgical History:  Procedure Laterality Date  . MASTECTOMY Left     No current facility-administered medications for this encounter.   Current Outpatient Medications  Medication Sig Dispense Refill Last Dose  . acetaminophen (TYLENOL) 500 MG tablet Take 1,000 mg by mouth in the morning and at bedtime.     Marland Kitchen aspirin EC 81 MG tablet Take 81 mg by mouth daily with supper.      . calcium-vitamin D (OSCAL WITH D) 500-200 MG-UNIT tablet Take 1 tablet by mouth 2 (two) times daily.      . dorzolamide-timolol (COSOPT) 22.3-6.8 MG/ML ophthalmic solution Place 1 drop into  both eyes 2 (two) times daily.     . famotidine (PEPCID) 40 MG tablet Take 20 mg by mouth daily as needed (heartburn/indigestion.).      Marland Kitchen loratadine (CLARITIN) 10 MG tablet Take 10 mg by  mouth daily as needed for allergies.     Marland Kitchen LUMIGAN 0.01 % SOLN Place 1 drop into both eyes at bedtime.   4   . metoprolol succinate (TOPROL-XL) 50 MG 24 hr tablet Take 50 mg by mouth daily with supper.      . Multiple Vitamin (MULTIVITAMIN WITH MINERALS) TABS tablet Take 1 tablet by mouth daily at 12 noon.     . Omega-3 Fatty Acids (FISH OIL) 1000 MG CAPS Take 1,000 mg by mouth daily at 12 noon.     . pravastatin (PRAVACHOL) 20 MG tablet Take 1 tablet (20 mg total) by mouth every other day. (Patient taking differently: Take 20 mg by mouth every other day. At night.) 45 tablet 3   . Probiotic Product (PROBIOTIC DAILY PO) Take 1 capsule by mouth daily.      . raloxifene (EVISTA) 60 MG tablet Take 60 mg by mouth every evening.      . sertraline (ZOLOFT) 50 MG tablet Take 50 mg by mouth daily.      Marland Kitchen spironolactone-hydrochlorothiazide (ALDACTAZIDE) 25-25 MG tablet TAKE 1/2 TABLET BY MOUTH DAILY. (Patient taking differently: Take 0.5 tablets by mouth daily. ) 45 tablet 3   . traZODone (DESYREL) 50 MG tablet Take 25 mg by mouth as directed.  0    No Known Allergies  Social History   Tobacco Use  . Smoking status: Never Smoker  . Smokeless tobacco: Never Used  Substance Use Topics  . Alcohol use: No    Family History  Problem Relation Age of Onset  . Diabetes Mother      Review of Systems  All other systems reviewed and are negative.   Objective:  Physical Exam  Vitals reviewed. Constitutional: She is oriented to person, place, and time.  HENT:  Head: Normocephalic and atraumatic.  Eyes: Pupils are equal, round, and reactive to light.  Cardiovascular: Normal rate and normal pulses.  Respiratory: Effort normal.  GI: Soft. Normal appearance.  Musculoskeletal:     Left hip: Tenderness and bony tenderness present. Decreased range of motion. Decreased strength.  Neurological: She is alert and oriented to person, place, and time.  Psychiatric: Her behavior is normal.    Vital signs  in last 24 hours:    Labs:   Estimated body mass index is 19.3 kg/m as calculated from the following:   Height as of 01/26/20: 5\' 5"  (1.651 m).   Weight as of 01/26/20: 52.6 kg.   Imaging Review Plain radiographs demonstrate severe degenerative joint disease of the left hip(s). The bone quality appears to be good for age and reported activity level.      Assessment/Plan:  End stage arthritis, left hip(s)  The patient history, physical examination, clinical judgement of the provider and imaging studies are consistent with end stage degenerative joint disease of the left hip(s) and total hip arthroplasty is deemed medically necessary. The treatment options including medical management, injection therapy, arthroscopy and arthroplasty were discussed at length. The risks and benefits of total hip arthroplasty were presented and reviewed. The risks due to aseptic loosening, infection, stiffness, dislocation/subluxation,  thromboembolic complications and other imponderables were discussed.  The patient acknowledged the explanation, agreed to proceed with the plan and consent was signed. Patient is being admitted  for inpatient treatment for surgery, pain control, PT, OT, prophylactic antibiotics, VTE prophylaxis, progressive ambulation and ADL's and discharge planning.The patient is planning to be discharged home with home health services (independent living area of SNF).

## 2020-02-02 ENCOUNTER — Inpatient Hospital Stay (HOSPITAL_COMMUNITY)
Admission: RE | Admit: 2020-02-02 | Discharge: 2020-02-05 | DRG: 470 | Disposition: A | Discharge status: Skilled Nursing Facility | Payer: Medicare Other | Attending: Orthopaedic Surgery | Admitting: Orthopaedic Surgery

## 2020-02-02 ENCOUNTER — Encounter (HOSPITAL_COMMUNITY): Payer: Self-pay | Admitting: Orthopaedic Surgery

## 2020-02-02 ENCOUNTER — Encounter (HOSPITAL_COMMUNITY)
Admission: RE | Disposition: A | Discharge status: Skilled Nursing Facility | Payer: Self-pay | Source: Home / Self Care | Attending: Orthopaedic Surgery

## 2020-02-02 ENCOUNTER — Other Ambulatory Visit: Payer: Self-pay

## 2020-02-02 ENCOUNTER — Ambulatory Visit (HOSPITAL_COMMUNITY): Payer: Medicare Other | Admitting: Anesthesiology

## 2020-02-02 ENCOUNTER — Observation Stay (HOSPITAL_COMMUNITY): Payer: Medicare Other

## 2020-02-02 ENCOUNTER — Ambulatory Visit (HOSPITAL_COMMUNITY): Payer: Medicare Other

## 2020-02-02 DIAGNOSIS — M1612 Unilateral primary osteoarthritis, left hip: Secondary | ICD-10-CM | POA: Diagnosis not present

## 2020-02-02 DIAGNOSIS — Z833 Family history of diabetes mellitus: Secondary | ICD-10-CM

## 2020-02-02 DIAGNOSIS — I110 Hypertensive heart disease with heart failure: Secondary | ICD-10-CM | POA: Diagnosis present

## 2020-02-02 DIAGNOSIS — Z96642 Presence of left artificial hip joint: Secondary | ICD-10-CM

## 2020-02-02 DIAGNOSIS — Z853 Personal history of malignant neoplasm of breast: Secondary | ICD-10-CM

## 2020-02-02 DIAGNOSIS — I509 Heart failure, unspecified: Secondary | ICD-10-CM | POA: Diagnosis present

## 2020-02-02 DIAGNOSIS — Z7982 Long term (current) use of aspirin: Secondary | ICD-10-CM

## 2020-02-02 DIAGNOSIS — Z79899 Other long term (current) drug therapy: Secondary | ICD-10-CM

## 2020-02-02 DIAGNOSIS — M8580 Other specified disorders of bone density and structure, unspecified site: Secondary | ICD-10-CM | POA: Diagnosis present

## 2020-02-02 DIAGNOSIS — D62 Acute posthemorrhagic anemia: Secondary | ICD-10-CM | POA: Diagnosis not present

## 2020-02-02 DIAGNOSIS — Z20822 Contact with and (suspected) exposure to covid-19: Secondary | ICD-10-CM | POA: Diagnosis present

## 2020-02-02 DIAGNOSIS — F418 Other specified anxiety disorders: Secondary | ICD-10-CM | POA: Diagnosis present

## 2020-02-02 DIAGNOSIS — M25752 Osteophyte, left hip: Secondary | ICD-10-CM | POA: Diagnosis present

## 2020-02-02 DIAGNOSIS — H9190 Unspecified hearing loss, unspecified ear: Secondary | ICD-10-CM | POA: Diagnosis present

## 2020-02-02 DIAGNOSIS — Z9012 Acquired absence of left breast and nipple: Secondary | ICD-10-CM

## 2020-02-02 DIAGNOSIS — Z419 Encounter for procedure for purposes other than remedying health state, unspecified: Secondary | ICD-10-CM

## 2020-02-02 DIAGNOSIS — E782 Mixed hyperlipidemia: Secondary | ICD-10-CM | POA: Diagnosis present

## 2020-02-02 HISTORY — DX: Presence of left artificial hip joint: Z96.642

## 2020-02-02 HISTORY — PX: TOTAL HIP ARTHROPLASTY: SHX124

## 2020-02-02 LAB — GLUCOSE, CAPILLARY: Glucose-Capillary: 158 mg/dL — ABNORMAL HIGH (ref 70–99)

## 2020-02-02 LAB — TYPE AND SCREEN
ABO/RH(D): B POS
Antibody Screen: NEGATIVE

## 2020-02-02 SURGERY — ARTHROPLASTY, HIP, TOTAL, ANTERIOR APPROACH
Anesthesia: Spinal | Site: Hip | Laterality: Left

## 2020-02-02 MED ORDER — LACTATED RINGERS IV SOLN: INTRAVENOUS | Status: DC

## 2020-02-02 MED ORDER — ACETAMINOPHEN 325 MG PO TABS
325.0000 mg | ORAL_TABLET | Freq: Four times a day (QID) | ORAL | Status: DC | PRN
  Administered 2020-02-02 – 2020-02-05 (×2): 650 mg via ORAL
  Filled 2020-02-02 (×2): qty 2

## 2020-02-02 MED ORDER — CHLORHEXIDINE GLUCONATE 0.12 % MT SOLN
15.0000 mL | Freq: Once | OROMUCOSAL | Status: AC
  Administered 2020-02-02: 15 mL via OROMUCOSAL

## 2020-02-02 MED ORDER — ONDANSETRON HCL 4 MG PO TABS
4.0000 mg | ORAL_TABLET | Freq: Four times a day (QID) | ORAL | Status: DC | PRN
  Administered 2020-02-04: 4 mg via ORAL
  Filled 2020-02-02: qty 1

## 2020-02-02 MED ORDER — PROPOFOL 1000 MG/100ML IV EMUL
INTRAVENOUS | Status: AC
  Filled 2020-02-02: qty 100

## 2020-02-02 MED ORDER — LACTATED RINGERS IV SOLN
INTRAVENOUS | Status: DC | PRN
Start: 2020-02-02 — End: 2020-02-02

## 2020-02-02 MED ORDER — LORATADINE 10 MG PO TABS
10.0000 mg | ORAL_TABLET | Freq: Every day | ORAL | Status: DC | PRN
  Administered 2020-02-03: 10 mg via ORAL
  Filled 2020-02-02: qty 1

## 2020-02-02 MED ORDER — DOCUSATE SODIUM 100 MG PO CAPS
100.0000 mg | ORAL_CAPSULE | Freq: Two times a day (BID) | ORAL | Status: DC
  Administered 2020-02-02 – 2020-02-05 (×6): 100 mg via ORAL
  Filled 2020-02-02 (×6): qty 1

## 2020-02-02 MED ORDER — METOCLOPRAMIDE HCL 5 MG PO TABS
5.0000 mg | ORAL_TABLET | Freq: Three times a day (TID) | ORAL | Status: DC | PRN
  Administered 2020-02-04: 5 mg via ORAL
  Filled 2020-02-02: qty 1

## 2020-02-02 MED ORDER — DIPHENHYDRAMINE HCL 12.5 MG/5ML PO ELIX: 12.5000 mg | ORAL_SOLUTION | ORAL | Status: DC | PRN

## 2020-02-02 MED ORDER — BUPIVACAINE IN DEXTROSE 0.75-8.25 % IT SOLN
INTRATHECAL | Status: DC | PRN
Start: 2020-02-02 — End: 2020-02-02
  Administered 2020-02-02: 1.6 mL via INTRATHECAL

## 2020-02-02 MED ORDER — ORAL CARE MOUTH RINSE: 15.0000 mL | Freq: Once | OROMUCOSAL | Status: AC

## 2020-02-02 MED ORDER — METHOCARBAMOL 500 MG PO TABS
500.0000 mg | ORAL_TABLET | Freq: Four times a day (QID) | ORAL | Status: DC | PRN
  Administered 2020-02-04 (×2): 500 mg via ORAL
  Filled 2020-02-02 (×2): qty 1

## 2020-02-02 MED ORDER — OXYCODONE HCL 5 MG PO TABS: 5.0000 mg | ORAL_TABLET | Freq: Once | ORAL | Status: DC | PRN

## 2020-02-02 MED ORDER — HYDROCODONE-ACETAMINOPHEN 7.5-325 MG PO TABS
1.0000 | ORAL_TABLET | ORAL | Status: DC | PRN
  Administered 2020-02-04: 1 via ORAL
  Filled 2020-02-02: qty 1

## 2020-02-02 MED ORDER — HYDROCODONE-ACETAMINOPHEN 5-325 MG PO TABS
1.0000 | ORAL_TABLET | ORAL | Status: DC | PRN
  Administered 2020-02-02 – 2020-02-04 (×6): 1 via ORAL
  Filled 2020-02-02 (×7): qty 1

## 2020-02-02 MED ORDER — DEXAMETHASONE SODIUM PHOSPHATE 10 MG/ML IJ SOLN
INTRAMUSCULAR | Status: AC
  Filled 2020-02-02: qty 1

## 2020-02-02 MED ORDER — DEXAMETHASONE SODIUM PHOSPHATE 10 MG/ML IJ SOLN
INTRAMUSCULAR | Status: DC | PRN
Start: 2020-02-02 — End: 2020-02-02
  Administered 2020-02-02: 6 mg via INTRAVENOUS

## 2020-02-02 MED ORDER — FENTANYL CITRATE (PF) 100 MCG/2ML IJ SOLN: 25.0000 ug | INTRAMUSCULAR | Status: DC | PRN

## 2020-02-02 MED ORDER — ONDANSETRON HCL 4 MG/2ML IJ SOLN
INTRAMUSCULAR | Status: DC | PRN
  Administered 2020-02-02: 4 mg via INTRAVENOUS

## 2020-02-02 MED ORDER — ASPIRIN 81 MG PO CHEW
81.0000 mg | CHEWABLE_TABLET | Freq: Two times a day (BID) | ORAL | Status: DC
  Administered 2020-02-02 – 2020-02-05 (×6): 81 mg via ORAL
  Filled 2020-02-02 (×6): qty 1

## 2020-02-02 MED ORDER — OXYCODONE HCL 5 MG/5ML PO SOLN: 5.0000 mg | Freq: Once | ORAL | Status: DC | PRN

## 2020-02-02 MED ORDER — MENTHOL 3 MG MT LOZG: 1.0000 | LOZENGE | OROMUCOSAL | Status: DC | PRN

## 2020-02-02 MED ORDER — ONDANSETRON HCL 4 MG/2ML IJ SOLN
INTRAMUSCULAR | Status: AC
  Filled 2020-02-02: qty 2

## 2020-02-02 MED ORDER — LATANOPROST 0.005 % OP SOLN
1.0000 [drp] | Freq: Every day | OPHTHALMIC | Status: DC
  Administered 2020-02-02 – 2020-02-04 (×3): 1 [drp] via OPHTHALMIC
  Filled 2020-02-02: qty 2.5

## 2020-02-02 MED ORDER — SODIUM CHLORIDE 0.9 % IV SOLN: INTRAVENOUS | Status: DC

## 2020-02-02 MED ORDER — MORPHINE SULFATE (PF) 2 MG/ML IV SOLN: 0.5000 mg | INTRAVENOUS | Status: DC | PRN

## 2020-02-02 MED ORDER — ONDANSETRON HCL 4 MG/2ML IJ SOLN
4.0000 mg | Freq: Four times a day (QID) | INTRAMUSCULAR | Status: DC | PRN
  Administered 2020-02-02: 4 mg via INTRAVENOUS
  Filled 2020-02-02: qty 2

## 2020-02-02 MED ORDER — SPIRONOLACTONE 12.5 MG HALF TABLET
12.5000 mg | ORAL_TABLET | Freq: Every day | ORAL | Status: DC
  Administered 2020-02-03 – 2020-02-05 (×3): 12.5 mg via ORAL
  Filled 2020-02-02 (×3): qty 1

## 2020-02-02 MED ORDER — FENTANYL CITRATE (PF) 100 MCG/2ML IJ SOLN
INTRAMUSCULAR | Status: AC
  Filled 2020-02-02: qty 2

## 2020-02-02 MED ORDER — SERTRALINE HCL 50 MG PO TABS
50.0000 mg | ORAL_TABLET | Freq: Every day | ORAL | Status: DC
  Administered 2020-02-03 – 2020-02-05 (×3): 50 mg via ORAL
  Filled 2020-02-02 (×3): qty 1

## 2020-02-02 MED ORDER — METOPROLOL SUCCINATE ER 50 MG PO TB24: 50.0000 mg | ORAL_TABLET | Freq: Every day | ORAL | Status: DC

## 2020-02-02 MED ORDER — HYDROCHLOROTHIAZIDE 12.5 MG PO CAPS
12.5000 mg | ORAL_CAPSULE | Freq: Every day | ORAL | Status: DC
  Administered 2020-02-03 – 2020-02-05 (×3): 12.5 mg via ORAL
  Filled 2020-02-02 (×3): qty 1

## 2020-02-02 MED ORDER — SODIUM CHLORIDE 0.9 % IR SOLN
Status: DC | PRN
  Administered 2020-02-02: 1000 mL

## 2020-02-02 MED ORDER — PHENOL 1.4 % MT LIQD: 1.0000 | OROMUCOSAL | Status: DC | PRN

## 2020-02-02 MED ORDER — FAMOTIDINE 20 MG PO TABS
20.0000 mg | ORAL_TABLET | Freq: Every day | ORAL | Status: DC | PRN
  Administered 2020-02-03 – 2020-02-04 (×2): 20 mg via ORAL
  Filled 2020-02-02 (×2): qty 1

## 2020-02-02 MED ORDER — RALOXIFENE HCL 60 MG PO TABS
60.0000 mg | ORAL_TABLET | Freq: Every evening | ORAL | Status: DC
  Administered 2020-02-02 – 2020-02-05 (×4): 60 mg via ORAL
  Filled 2020-02-02 (×4): qty 1

## 2020-02-02 MED ORDER — DORZOLAMIDE HCL-TIMOLOL MAL 2-0.5 % OP SOLN
1.0000 [drp] | Freq: Two times a day (BID) | OPHTHALMIC | Status: DC
  Administered 2020-02-02 – 2020-02-05 (×6): 1 [drp] via OPHTHALMIC
  Filled 2020-02-02: qty 10

## 2020-02-02 MED ORDER — CEFAZOLIN SODIUM-DEXTROSE 2-4 GM/100ML-% IV SOLN
2.0000 g | INTRAVENOUS | Status: AC
  Administered 2020-02-02: 2 g via INTRAVENOUS
  Filled 2020-02-02: qty 100

## 2020-02-02 MED ORDER — ADULT MULTIVITAMIN W/MINERALS CH
1.0000 | ORAL_TABLET | Freq: Every day | ORAL | Status: DC
  Administered 2020-02-05: 1 via ORAL
  Filled 2020-02-02: qty 1

## 2020-02-02 MED ORDER — METOCLOPRAMIDE HCL 5 MG/ML IJ SOLN
5.0000 mg | Freq: Three times a day (TID) | INTRAMUSCULAR | Status: DC | PRN
  Administered 2020-02-02: 10 mg via INTRAVENOUS
  Filled 2020-02-02: qty 2

## 2020-02-02 MED ORDER — ONDANSETRON HCL 4 MG/2ML IJ SOLN: 4.0000 mg | Freq: Once | INTRAMUSCULAR | Status: DC | PRN

## 2020-02-02 MED ORDER — 0.9 % SODIUM CHLORIDE (POUR BTL) OPTIME
TOPICAL | Status: DC | PRN
  Administered 2020-02-02: 1000 mL

## 2020-02-02 MED ORDER — CEFAZOLIN SODIUM-DEXTROSE 1-4 GM/50ML-% IV SOLN
1.0000 g | Freq: Four times a day (QID) | INTRAVENOUS | Status: AC
  Administered 2020-02-02 (×2): 1 g via INTRAVENOUS
  Filled 2020-02-02 (×2): qty 50

## 2020-02-02 MED ORDER — ALUM & MAG HYDROXIDE-SIMETH 200-200-20 MG/5ML PO SUSP: 30.0000 mL | ORAL | Status: DC | PRN

## 2020-02-02 MED ORDER — PROPOFOL 10 MG/ML IV BOLUS
INTRAVENOUS | Status: DC | PRN
  Administered 2020-02-02: 20 mg via INTRAVENOUS

## 2020-02-02 MED ORDER — PHENYLEPHRINE HCL (PRESSORS) 10 MG/ML IV SOLN
INTRAVENOUS | Status: AC
  Filled 2020-02-02: qty 1

## 2020-02-02 MED ORDER — PROPOFOL 500 MG/50ML IV EMUL
INTRAVENOUS | Status: DC | PRN
  Administered 2020-02-02: 50 ug/kg/min via INTRAVENOUS

## 2020-02-02 MED ORDER — METHOCARBAMOL 500 MG IVPB - SIMPLE MED
500.0000 mg | Freq: Four times a day (QID) | INTRAVENOUS | Status: DC | PRN
  Filled 2020-02-02: qty 50

## 2020-02-02 MED ORDER — PHENYLEPHRINE HCL-NACL 10-0.9 MG/250ML-% IV SOLN
INTRAVENOUS | Status: DC | PRN
  Administered 2020-02-02: 25 ug/min via INTRAVENOUS

## 2020-02-02 MED ORDER — FENTANYL CITRATE (PF) 100 MCG/2ML IJ SOLN
INTRAMUSCULAR | Status: DC | PRN
  Administered 2020-02-02: 50 ug via INTRAVENOUS

## 2020-02-02 MED ORDER — POVIDONE-IODINE 10 % EX SWAB
2.0000 "application " | Freq: Once | CUTANEOUS | Status: AC
  Administered 2020-02-02: 2 via TOPICAL

## 2020-02-02 MED ORDER — STERILE WATER FOR IRRIGATION IR SOLN
Status: DC | PRN
  Administered 2020-02-02: 1000 mL

## 2020-02-02 MED ORDER — SPIRONOLACTONE-HCTZ 25-25 MG PO TABS: 0.5000 | ORAL_TABLET | Freq: Every day | ORAL | Status: DC

## 2020-02-02 MED ORDER — TRANEXAMIC ACID-NACL 1000-0.7 MG/100ML-% IV SOLN
1000.0000 mg | INTRAVENOUS | Status: AC
  Administered 2020-02-02: 1000 mg via INTRAVENOUS
  Filled 2020-02-02: qty 100

## 2020-02-02 MED ORDER — CALCIUM CARBONATE-VITAMIN D 500-200 MG-UNIT PO TABS
1.0000 | ORAL_TABLET | Freq: Two times a day (BID) | ORAL | Status: DC
  Administered 2020-02-02 – 2020-02-05 (×6): 1 via ORAL
  Filled 2020-02-02 (×7): qty 1

## 2020-02-02 SURGICAL SUPPLY — 45 items
BAG ZIPLOCK 12X15 (MISCELLANEOUS) IMPLANT
BENZOIN TINCTURE PRP APPL 2/3 (GAUZE/BANDAGES/DRESSINGS) IMPLANT
BLADE SAW SGTL 18X1.27X75 (BLADE) ×2 IMPLANT
BLADE SAW SGTL 18X1.27X75MM (BLADE) ×1
CLOSURE STERI-STRIP 1/2X4 (GAUZE/BANDAGES/DRESSINGS) ×1
CLOSURE WOUND 1/2 X4 (GAUZE/BANDAGES/DRESSINGS)
CLSR STERI-STRIP ANTIMIC 1/2X4 (GAUZE/BANDAGES/DRESSINGS) ×2 IMPLANT
COVER PERINEAL POST (MISCELLANEOUS) ×3 IMPLANT
COVER SURGICAL LIGHT HANDLE (MISCELLANEOUS) ×3 IMPLANT
COVER WAND RF STERILE (DRAPES) ×3 IMPLANT
CUP SECTOR GRIPTON 50MM (Cup) ×3 IMPLANT
DRAPE STERI IOBAN 125X83 (DRAPES) ×3 IMPLANT
DRAPE U-SHAPE 47X51 STRL (DRAPES) ×6 IMPLANT
DRSG AQUACEL AG ADV 3.5X10 (GAUZE/BANDAGES/DRESSINGS) ×3 IMPLANT
DRSG AQUACEL AG ADV 3.5X14 (GAUZE/BANDAGES/DRESSINGS) ×3 IMPLANT
DURAPREP 26ML APPLICATOR (WOUND CARE) ×3 IMPLANT
ELECT REM PT RETURN 15FT ADLT (MISCELLANEOUS) ×3 IMPLANT
GAUZE XEROFORM 1X8 LF (GAUZE/BANDAGES/DRESSINGS) ×3 IMPLANT
GLOVE BIO SURGEON STRL SZ7.5 (GLOVE) ×3 IMPLANT
GLOVE BIOGEL PI IND STRL 8 (GLOVE) ×2 IMPLANT
GLOVE BIOGEL PI INDICATOR 8 (GLOVE) ×4
GLOVE ECLIPSE 8.0 STRL XLNG CF (GLOVE) ×3 IMPLANT
GOWN STRL REUS W/TWL XL LVL3 (GOWN DISPOSABLE) ×6 IMPLANT
HANDPIECE INTERPULSE COAX TIP (DISPOSABLE) ×3
HEAD FEM STD 32X+1 STRL (Hips) ×3 IMPLANT
HOLDER FOLEY CATH W/STRAP (MISCELLANEOUS) ×3 IMPLANT
KIT TURNOVER KIT A (KITS) IMPLANT
LINER ACET PNNCL PLUS4 NEUTRAL (Hips) IMPLANT
LINER ACETABULAR 32X50 (Liner) ×3 IMPLANT
PACK ANTERIOR HIP CUSTOM (KITS) ×3 IMPLANT
PENCIL SMOKE EVACUATOR (MISCELLANEOUS) IMPLANT
PINNACLE PLUS 4 NEUTRAL (Hips) IMPLANT
SET HNDPC FAN SPRY TIP SCT (DISPOSABLE) ×1 IMPLANT
STAPLER VISISTAT 35W (STAPLE) IMPLANT
STEM CORAIL KA11 (Stem) ×3 IMPLANT
STRIP CLOSURE SKIN 1/2X4 (GAUZE/BANDAGES/DRESSINGS) IMPLANT
SUT ETHIBOND NAB CT1 #1 30IN (SUTURE) ×3 IMPLANT
SUT ETHILON 2 0 PS N (SUTURE) IMPLANT
SUT MNCRL AB 4-0 PS2 18 (SUTURE) IMPLANT
SUT VIC AB 0 CT1 36 (SUTURE) ×3 IMPLANT
SUT VIC AB 1 CT1 36 (SUTURE) ×3 IMPLANT
SUT VIC AB 2-0 CT1 27 (SUTURE) ×6
SUT VIC AB 2-0 CT1 TAPERPNT 27 (SUTURE) ×2 IMPLANT
TRAY FOLEY MTR SLVR 16FR STAT (SET/KITS/TRAYS/PACK) IMPLANT
YANKAUER SUCT BULB TIP 10FT TU (MISCELLANEOUS) ×3 IMPLANT

## 2020-02-02 NOTE — Plan of Care (Signed)
  Problem: Nutrition: Goal: Adequate nutrition will be maintained Outcome: Progressing   Problem: Coping: Goal: Level of anxiety will decrease Outcome: Progressing   Problem: Pain Managment: Goal: General experience of comfort will improve Outcome: Progressing   

## 2020-02-02 NOTE — Op Note (Signed)
NAME: CATHERINE, CUBERO MEDICAL RECORD WU:[id number] ACCOUNT [id number] DATE OF BIRTH:03/25/1930 FACILITY: [facility] LOCATION: [facility] PHYSICIAN:Vineet Kinney Kerry Fort, MD  OPERATIVE REPORT  DATE OF PROCEDURE:  02/02/2020  PREOPERATIVE DIAGNOSIS:  Primary osteoarthritis and degenerative joint disease, left hip.  POSTOPERATIVE DIAGNOSIS:  Primary osteoarthritis and degenerative joint disease, left hip.  PROCEDURE:  Left total hip arthroplasty through direct anterior approach.  IMPLANTS:  DePuy Sector Gription acetabular component size 50, size 32+0 neutral polyethylene liner, size 11 Corail femoral component with standard offset, size 32+1 metal hip ball.  SURGEON:  Lind Guest. Ninfa Linden, MD  ASSISTANT:  Erskine Emery, PA-C  ANESTHESIA:  Spinal.  ANTIBIOTICS:  Two grams IV Ancef.  ESTIMATED BLOOD LOSS:  250 mL.  COMPLICATIONS:  None.  INDICATIONS:  The patient is a very pleasant and active, healthy 86 year old with debilitating arthritis involving her left hip.  This has been confirmed with clinical exam and x-rays.  Her pain is daily and at this point is detrimentally affecting her  mobility, her quality of life, and her activities of daily living.  We have tried and failed all forms of conservative treatment.  At this point, she is miserable and does wish to proceed with a total hip arthroplasty.  I have talked to her and her son  in length about this.  We explained in detail about the risk of acute blood loss anemia, nerve or vessel injury, fracture, infection, dislocation, DVT and implant failure.  I talked about the goals being decreased pain, improve mobility and overall  improve quality of life.  DESCRIPTION OF PROCEDURE:  After informed consent was obtained and appropriate left hip was marked, she was brought to the operating room and sat up on her stretcher where spinal anesthesia was then obtained.  She was laid in supine position on a  stretcher.  Foley catheter was placed and  traction boots were placed on both her feet.  She was placed supine on the Hana fracture table, the perineal post in place and both legs in line skeletal traction device and no traction applied.  Her left  operative hip was prepped and draped with DuraPrep and sterile drapes.  I did assess her radiographically prior to prepping and draping, so we could get a good idea of her offset and leg length.  Of note, she does have significant protrusio.  After  prepping and draping with DuraPrep and sterile drapes, time-out was called to identify correct patient, correct left hip.  We then made an incision just distal and posterior to the anterior superior iliac spine and carried this slightly obliquely down  the leg.  Dissected down tensor fascia lata muscle, tendon and then the tensor fascia was then divided longitudinally to proceed with direct anterior approach to the hip.  We identified and cauterized circumflex vessels and identified the hip capsule,  opened the hip capsule in an L-type format finding very large joint effusion.  There was significant periarticular osteophytes around the femoral head and neck.  I placed Cobra retractors within the joint capsule and made our femoral neck cut with an  oscillating saw just proximal to the lesser trochanter and completed this with an osteotome and placed a corkscrew guide in the femoral head and removed the femoral heads entirety and found a wide area completely devoid of cartilage.  I then placed a  bent Hohmann over the medial acetabular rim and removed remnants of the acetabular labrum and other debris.  There was awfully significant sclerotic changes and protrusio within  the hip socket.  We gently reamed starting from a size 43 reamer in stepwise  increments up to a size 49 with all reamers under direct visualization, the last reamer under direct visualization and direct fluoroscopy, so we could obtain our depth of reaming, our inclination and anteversion.  I then  placed the real DePuy Sector  Gription acetabular component size 50 and we went with a 32+4 neutral polyethylene liner based on what we felt we had medialized her enough, but we ended up switching out to a 32+0 liner.  Attention was turned to the femur.  With the leg externally  rotated to 120 degrees, extended and adducted, we are to place a Mueller retractor medially and a Hohmann retractor around the greater trochanter, released lateral joint capsule and used a box-cutting osteotome to enter the femoral canal and a rongeur to  lateralize.  Then began broaching using the Corail broaching system from a size 8 going up to a size 11.  With a size 11 in place, standard offset femoral neck and a 32+1 hip ball, reduced this in the acetabulum.  We had definitely increased her leg  length and offset and she was stable on clinical exam.  We then dislocated the hip and removed the trial components.  I placed the real Corail femoral component size 11 with standard offset and the real 32+1 metal hip ball and again reduced this in the  acetabulum and it was stable.  We then irrigated the soft tissue with normal saline solution.  We assessed the joint stability again mechanically and radiographically and then we closed the joint capsule with interrupted #1 Ethibond suture, followed by  closing the tensor fascia with #1 Vicryl.  0 Vicryl was used to close deep tissue and 2-0 Vicryl was used to close subcutaneous tissue.  The skin was reapproximated with staples.  Xeroform and Aquacel dressing was applied.  She was taken off the Hana  table and taken to recovery room in stable condition.  All final counts were correct.  There were no complications noted.  Of note, Rexene Edison, PA-C, assisted during the entire case and his assistance was crucial for facilitating all aspects of the case.  CN/NUANCE  D:02/02/2020 T:02/02/2020 JOB:[id number]

## 2020-02-02 NOTE — Anesthesia Procedure Notes (Signed)
Procedure Name: MAC Date/Time: 02/02/2020 8:30 AM Performed by: Lissa Morales, CRNA Pre-anesthesia Checklist: Patient identified, Emergency Drugs available, Suction available, Patient being monitored and Timeout performed Patient Re-evaluated:Patient Re-evaluated prior to induction Oxygen Delivery Method: Simple face mask Placement Confirmation: positive ETCO2

## 2020-02-02 NOTE — Anesthesia Postprocedure Evaluation (Signed)
Anesthesia Post Note  Patient: Susan Spencer  Procedure(s) Performed: LEFT TOTAL HIP ARTHROPLASTY ANTERIOR APPROACH (Left Hip)     Patient location during evaluation: PACU Anesthesia Type: Spinal Level of consciousness: oriented and awake and alert Pain management: pain level controlled Vital Signs Assessment: post-procedure vital signs reviewed and stable Respiratory status: spontaneous breathing, respiratory function stable and nonlabored ventilation Cardiovascular status: blood pressure returned to baseline and stable Postop Assessment: no headache, no backache, no apparent nausea or vomiting and spinal receding Anesthetic complications: no   No complications documented.  Last Vitals:  Vitals:   02/02/20 1330 02/02/20 1345  BP: 94/79 136/63  Pulse: 77 84  Resp:    Temp:    SpO2: 100% 100%    Last Pain:  Vitals:   02/02/20 1300  TempSrc:   PainSc: 5                  Lucretia Kern

## 2020-02-02 NOTE — Interval H&P Note (Signed)
History and Physical Interval Note: The patient understands that she is here for right total hip arthroplasty today for her left hip.  There has been no acute change in her medical status.  See previous H&P.  The risk and benefits of surgery have been explained in detail and informed consent is obtained.  The left hip has been marked.  02/02/2020 6:52 AM  Susan Spencer  has presented today for surgery, with the diagnosis of left hip osteoarthritis.  The various methods of treatment have been discussed with the patient and family. After consideration of risks, benefits and other options for treatment, the patient has consented to  Procedure(s): LEFT TOTAL HIP ARTHROPLASTY ANTERIOR APPROACH (Left) as a surgical intervention.  The patient's history has been reviewed, patient examined, no change in status, stable for surgery.  I have reviewed the patient's chart and labs.  Questions were answered to the patient's satisfaction.     Kathryne Hitch

## 2020-02-02 NOTE — Anesthesia Procedure Notes (Signed)
Spinal  Patient location during procedure: OR End time: 02/02/2020 8:36 AM Staffing Performed: resident/CRNA  Resident/CRNA: Lissa Morales, CRNA Preanesthetic Checklist Completed: patient identified, IV checked, site marked, risks and benefits discussed, surgical consent, monitors and equipment checked, pre-op evaluation and timeout performed Spinal Block Patient position: sitting Prep: Betadine and DuraPrep Patient monitoring: heart rate, continuous pulse ox and blood pressure Approach: midline Injection technique: single-shot Needle Needle type: Pencan  Needle gauge: 24 G Needle length: 9 cm Additional Notes Expiration date of kit checked and confirmed. Patient tolerated procedure well, without complications.

## 2020-02-02 NOTE — Brief Op Note (Signed)
02/02/2020  10:05 AM  PATIENT:  Susan Spencer  86 y.o. female  PRE-OPERATIVE DIAGNOSIS:  left hip osteoarthritis  POST-OPERATIVE DIAGNOSIS:  left hip osteoarthritis  PROCEDURE:  Procedure(s): LEFT TOTAL HIP ARTHROPLASTY ANTERIOR APPROACH (Left)  SURGEON:  Surgeon(s) and Role:    Kathryne Hitch, MD - Primary  PHYSICIAN ASSISTANT: Rexene Edison, PA-C  ANESTHESIA:   spinal  EBL:  250 mL   COUNTS:  YES  DICTATION: .Other Dictation: Dictation Number [id number]  PLAN OF CARE: Admit for overnight observation  PATIENT DISPOSITION:  PACU - hemodynamically stable.   Delay start of Pharmacological VTE agent (>24hrs) due to surgical blood loss or risk of bleeding: no

## 2020-02-02 NOTE — Evaluation (Signed)
Physical Therapy Evaluation Patient Details Name: Susan Spencer MRN: [id number] DOB: 05/22/1930 Today's Date: 02/02/2020   History of Present Illness  Pt s/p L THR and with hx of PVD, ostopenia, MVP and Breast CA s/p L mastectomy  Clinical Impression  Pt s/p L THR and presents with decreased L LE strength/ROM, post op pain and orthostatic hypotension with attempts at OOB (BP 79/37 with move to sitting) limiting functional mobility.  Pt would benefit from follow up rehab at SNF level to maximize IND and safety prior to return home with ltd assist.    Follow Up Recommendations SNF    Equipment Recommendations  None recommended by PT    Recommendations for Other Services       Precautions / Restrictions Precautions Precautions: Fall Restrictions Weight Bearing Restrictions: No Other Position/Activity Restrictions: WBAT      Mobility  Bed Mobility Overal bed mobility: Needs Assistance Bed Mobility: Sit to Supine;Supine to Sit     Supine to sit: Min assist;Mod assist Sit to supine: Mod assist;+2 for physical assistance   General bed mobility comments: cues for sequence and use of R LE to self assist.  INcreased assist to return to bed 2* orthostatic BP  Transfers                 General transfer comment: deferred 2* orthostatic hypotension  Ambulation/Gait                Stairs            Wheelchair Mobility    Modified Rankin (Stroke Patients Only)       Balance Overall balance assessment: Needs assistance Sitting-balance support: No upper extremity supported;Feet supported Sitting balance-Leahy Scale: Good                                       Pertinent Vitals/Pain Pain Assessment: Faces Faces Pain Scale: Hurts even more Pain Location: L hip with movement Pain Descriptors / Indicators: Aching;Sore Pain Intervention(s): Limited activity within patient's tolerance;Monitored during session;Premedicated before session;Ice  applied    Home Living Family/patient expects to be discharged to:: Skilled nursing facility                      Prior Function Level of Independence: Independent with assistive device(s)         Comments: use of cane     Hand Dominance        Extremity/Trunk Assessment   Upper Extremity Assessment Upper Extremity Assessment: Overall WFL for tasks assessed    Lower Extremity Assessment Lower Extremity Assessment: LLE deficits/detail    Cervical / Trunk Assessment Cervical / Trunk Assessment: Kyphotic  Communication   Communication: HOH  Cognition Arousal/Alertness: Awake/alert Behavior During Therapy: WFL for tasks assessed/performed Overall Cognitive Status: Within Functional Limits for tasks assessed                                        General Comments      Exercises Total Joint Exercises Ankle Circles/Pumps: AROM;Both;15 reps;Supine Heel Slides: AAROM;Left;10 reps;Supine   Assessment/Plan    PT Assessment Patient needs continued PT services  PT Problem List Decreased strength;Decreased range of motion;Decreased activity tolerance;Decreased balance;Decreased mobility;Decreased knowledge of use of DME;Pain       PT Treatment Interventions DME  instruction;Gait training;Functional mobility training;Therapeutic activities;Therapeutic exercise;Balance training;Patient/family education    PT Goals (Current goals can be found in the Care Plan section)  Acute Rehab PT Goals Patient Stated Goal: Rehab at [facility] and then home PT Goal Formulation: With patient Time For Goal Achievement: 02/09/20 Potential to Achieve Goals: Good    Frequency 7X/week   Barriers to discharge        Co-evaluation               AM-PAC PT "6 Clicks" Mobility  Outcome Measure Help needed turning from your back to your side while in a flat bed without using bedrails?: A Lot Help needed moving from lying on your back to sitting on the side of a  flat bed without using bedrails?: A Lot Help needed moving to and from a bed to a chair (including a wheelchair)?: A Lot Help needed standing up from a chair using your arms (e.g., wheelchair or bedside chair)?: A Lot Help needed to walk in hospital room?: A Lot Help needed climbing 3-5 steps with a railing? : A Lot 6 Click Score: 12    End of Session Equipment Utilized During Treatment: Gait belt Activity Tolerance: Other (comment) (orthostatic) Patient left: in bed;with call bell/phone within reach;with bed alarm set;with nursing/sitter in room Nurse Communication: Mobility status;Other (comment) (BP) PT Visit Diagnosis: Difficulty in walking, not elsewhere classified (R26.2)    Time: 6195-0932 PT Time Calculation (min) (ACUTE ONLY): 34 min   Charges:   PT Evaluation $PT Eval Low Complexity: 1 Low PT Treatments $Therapeutic Activity: 8-22 mins        Debe Coder PT Acute Rehabilitation Services Pager [phone] Office [phone]   Laysa Kimmey 02/02/2020, 8:01 PM

## 2020-02-02 NOTE — Plan of Care (Signed)
Plan of care 

## 2020-02-02 NOTE — Transfer of Care (Signed)
Immediate Anesthesia Transfer of Care Note  Patient: Susan Spencer  Procedure(s) Performed: LEFT TOTAL HIP ARTHROPLASTY ANTERIOR APPROACH (Left Hip)  Patient Location: PACU  Anesthesia Type:Spinal  Level of Consciousness: awake, alert , oriented and patient cooperative  Airway & Oxygen Therapy: Patient Spontanous Breathing and Patient connected to face mask oxygen  Post-op Assessment: Report given to RN, Post -op Vital signs reviewed and stable and Patient moving all extremities X 4  Post vital signs: stable  Last Vitals:  Vitals Value Taken Time  BP 134/51 02/02/20 1024  Temp    Pulse 84 02/02/20 1028  Resp 16 02/02/20 1028  SpO2 100 % 02/02/20 1028  Vitals shown include unvalidated device data.  Last Pain:  Vitals:   02/02/20 0703  TempSrc:   PainSc: 0-No pain      Patients Stated Pain Goal: 3 (02/02/20 0703)  Complications: No complications documented.

## 2020-02-02 NOTE — Progress Notes (Signed)
1330 Patient states she feels faint and sweaty, b/p 94/79 placed in trendelenberg, cbg checked 158. Patient then turned to side to vomit. Then raised up states feeling some better, b/p recheck 136/63.  Dr Magnus Ivan made aware D Susann Givens RN

## 2020-02-03 DIAGNOSIS — Z20822 Contact with and (suspected) exposure to covid-19: Secondary | ICD-10-CM | POA: Diagnosis present

## 2020-02-03 DIAGNOSIS — Z7982 Long term (current) use of aspirin: Secondary | ICD-10-CM | POA: Diagnosis not present

## 2020-02-03 DIAGNOSIS — D62 Acute posthemorrhagic anemia: Secondary | ICD-10-CM | POA: Diagnosis not present

## 2020-02-03 DIAGNOSIS — Z79899 Other long term (current) drug therapy: Secondary | ICD-10-CM | POA: Diagnosis not present

## 2020-02-03 DIAGNOSIS — I110 Hypertensive heart disease with heart failure: Secondary | ICD-10-CM | POA: Diagnosis present

## 2020-02-03 DIAGNOSIS — M25752 Osteophyte, left hip: Secondary | ICD-10-CM | POA: Diagnosis present

## 2020-02-03 DIAGNOSIS — Z9012 Acquired absence of left breast and nipple: Secondary | ICD-10-CM | POA: Diagnosis not present

## 2020-02-03 DIAGNOSIS — F418 Other specified anxiety disorders: Secondary | ICD-10-CM | POA: Diagnosis present

## 2020-02-03 DIAGNOSIS — I509 Heart failure, unspecified: Secondary | ICD-10-CM | POA: Diagnosis present

## 2020-02-03 DIAGNOSIS — Z833 Family history of diabetes mellitus: Secondary | ICD-10-CM | POA: Diagnosis not present

## 2020-02-03 DIAGNOSIS — E782 Mixed hyperlipidemia: Secondary | ICD-10-CM | POA: Diagnosis present

## 2020-02-03 DIAGNOSIS — M1612 Unilateral primary osteoarthritis, left hip: Secondary | ICD-10-CM | POA: Diagnosis present

## 2020-02-03 DIAGNOSIS — Z853 Personal history of malignant neoplasm of breast: Secondary | ICD-10-CM | POA: Diagnosis not present

## 2020-02-03 DIAGNOSIS — M8580 Other specified disorders of bone density and structure, unspecified site: Secondary | ICD-10-CM | POA: Diagnosis present

## 2020-02-03 DIAGNOSIS — H9190 Unspecified hearing loss, unspecified ear: Secondary | ICD-10-CM | POA: Diagnosis present

## 2020-02-03 LAB — CBC
HCT: 26 % — ABNORMAL LOW (ref 36.0–46.0)
Hemoglobin: 8.6 g/dL — ABNORMAL LOW (ref 12.0–15.0)
MCH: 31.9 pg (ref 26.0–34.0)
MCHC: 33.1 g/dL (ref 30.0–36.0)
MCV: 96.3 fL (ref 80.0–100.0)
Platelets: 291 10*3/uL (ref 150–400)
RBC: 2.7 MIL/uL — ABNORMAL LOW (ref 3.87–5.11)
RDW: 13.3 % (ref 11.5–15.5)
WBC: 14.3 10*3/uL — ABNORMAL HIGH (ref 4.0–10.5)
nRBC: 0 % (ref 0.0–0.2)

## 2020-02-03 LAB — BASIC METABOLIC PANEL
Anion gap: 9 (ref 5–15)
BUN: 18 mg/dL (ref 8–23)
CO2: 22 mmol/L (ref 22–32)
Calcium: 7.6 mg/dL — ABNORMAL LOW (ref 8.9–10.3)
Chloride: 96 mmol/L — ABNORMAL LOW (ref 98–111)
Creatinine, Ser: 0.59 mg/dL (ref 0.44–1.00)
GFR calc Af Amer: >60 mL/min (ref >60)
GFR calc non Af Amer: >60 mL/min (ref >60)
Glucose, Bld: 107 mg/dL — ABNORMAL HIGH (ref 70–99)
Potassium: 3.5 mmol/L (ref 3.5–5.1)
Sodium: 127 mmol/L — ABNORMAL LOW (ref 135–145)

## 2020-02-03 MED ORDER — LORATADINE 10 MG PO TABS
10.0000 mg | ORAL_TABLET | Freq: Every day | ORAL | Status: DC
  Administered 2020-02-04 – 2020-02-05 (×2): 10 mg via ORAL
  Filled 2020-02-03 (×2): qty 1

## 2020-02-03 MED ORDER — METOPROLOL SUCCINATE ER 50 MG PO TB24
50.0000 mg | ORAL_TABLET | Freq: Every day | ORAL | Status: DC
  Administered 2020-02-03 – 2020-02-05 (×3): 50 mg via ORAL
  Filled 2020-02-03 (×3): qty 1

## 2020-02-03 MED ORDER — PREDNISONE 20 MG PO TABS
20.0000 mg | ORAL_TABLET | Freq: Once | ORAL | Status: AC
  Administered 2020-02-03: 20 mg via ORAL
  Filled 2020-02-03: qty 1

## 2020-02-03 NOTE — Plan of Care (Signed)
  Problem: Clinical Measurements: Goal: Ability to maintain clinical measurements within normal limits will improve Outcome: Progressing Goal: Will remain free from infection Outcome: Progressing Goal: Diagnostic test results will improve Outcome: Progressing   Problem: Activity: Goal: Risk for activity intolerance will decrease Outcome: Progressing   Problem: Nutrition: Goal: Adequate nutrition will be maintained Outcome: Progressing   Problem: Coping: Goal: Level of anxiety will decrease Outcome: Progressing   Problem: Elimination: Goal: Will not experience complications related to urinary retention Outcome: Progressing   Problem: Safety: Goal: Ability to remain free from injury will improve Outcome: Progressing

## 2020-02-03 NOTE — Progress Notes (Signed)
Patient c/o feeling "heart racing." Patient on continuous pulse ox, reading 114 bpm. Vs checked, charted. Heart auscultated, tachycardic but regular. Patient breathing regularly but reports feeling anxious and "a little" short of breath. Administered scheduled medications, IV SL. Before leaving room patient's pulse 102 bpm. MD paged to update.

## 2020-02-03 NOTE — Progress Notes (Signed)
Subjective: 1 Day Post-Op Procedure(s) (LRB): LEFT TOTAL HIP ARTHROPLASTY ANTERIOR APPROACH (Left) Patient reports pain as moderate.  Complaining of decreased hearing post-op. Some nausea this AM.   Objective: Vital signs in last 24 hours: Temp:  [97.9 F (36.6 C)-99.1 F (37.3 C)] 98.8 F (37.1 C) (06/26 0939) Pulse Rate:  [77-109] 96 (06/26 0939) Resp:  [12-18] 18 (06/26 0939) BP: (94-150)/(41-79) 146/46 (06/26 0939) SpO2:  [96 %-100 %] 99 % (06/26 0939)  Intake/Output from previous day: 06/25 0701 - 06/26 0700 In: 4162.3 [P.O.:720; I.V.:3292.3; IV Piggyback:150] Out: 1425 [Urine:975; Emesis/NG output:200; Blood:250] Intake/Output this shift: No intake/output data recorded.  Recent Labs    02/03/20 0324  HGB 8.6*   Recent Labs    02/03/20 0324  WBC 14.3*  RBC 2.70*  HCT 26.0*  PLT 291   Recent Labs    02/03/20 0324  NA 127*  K 3.5  CL 96*  CO2 22  BUN 18  CREATININE 0.59  GLUCOSE 107*  CALCIUM 7.6*   No results for input(s): LABPT, INR in the last 72 hours.  General awake and alert.   Left lower extremity: Dorsiflexion/Plantar flexion intact Incision: dressing C/D/I and scant drainage Compartment soft   Assessment/Plan: 1 Day Post-Op Procedure(s) (LRB): LEFT TOTAL HIP ARTHROPLASTY ANTERIOR APPROACH (Left) Up with therapy  Will repeat labs in AM Hgb 8.6 today.  Will start Claritin for possible congestion as source of decreased hearing       Jarek Longton 02/03/2020, 11:13 AM

## 2020-02-03 NOTE — Progress Notes (Signed)
Physical Therapy Treatment Patient Details Name: Susan Spencer MRN: [id number] DOB: 1930/05/16 Today's Date: 02/03/2020    History of Present Illness Pt s/p L THR and with hx of PVD, ostopenia, MVP and Breast CA s/p L mastectomy    PT Comments    Pt cooperative and progressed to ambulating short distance in hall with no c/o dizziness.  Bp supine 150/60; sitting 139/54, standing 146/69.   Follow Up Recommendations  SNF     Equipment Recommendations  None recommended by PT    Recommendations for Other Services       Precautions / Restrictions Precautions Precautions: Fall Restrictions Weight Bearing Restrictions: No Other Position/Activity Restrictions: WBAT    Mobility  Bed Mobility Overal bed mobility: Needs Assistance Bed Mobility: Supine to Sit     Supine to sit: Min assist;Mod assist     General bed mobility comments: cues for sequence and use of R LE to self assist.   Transfers Overall transfer level: Needs assistance Equipment used: Rolling walker (2 wheeled) Transfers: Sit to/from Stand Sit to Stand: Min assist;Mod assist         General transfer comment: cues for LE management and use of UEs to self assist; physical assist to bring wt up and fwd and to balance in intial standing  Ambulation/Gait Ambulation/Gait assistance: Min assist;Mod assist Gait Distance (Feet): 23 Feet Assistive device: Rolling walker (2 wheeled) Gait Pattern/deviations: Step-to pattern;Decreased step length - right;Decreased step length - left;Shuffle;Trunk flexed Gait velocity: decr   General Gait Details: cues for sequence, posture and position from [employer]             Wheelchair Mobility    Modified Rankin (Stroke Patients Only)       Balance Overall balance assessment: Needs assistance Sitting-balance support: No upper extremity supported;Feet supported Sitting balance-Leahy Scale: Good     Standing balance support: Bilateral upper extremity  supported Standing balance-Leahy Scale: Poor                              Cognition Arousal/Alertness: Awake/alert Behavior During Therapy: WFL for tasks assessed/performed Overall Cognitive Status: Within Functional Limits for tasks assessed                                        Exercises Total Joint Exercises Ankle Circles/Pumps: AROM;Both;15 reps;Supine Quad Sets: AROM;Both;10 reps;Supine Heel Slides: AAROM;Left;Supine;20 reps Hip ABduction/ADduction: AAROM;Left;15 reps;Supine    General Comments        Pertinent Vitals/Pain Pain Assessment: 0-10 Pain Score: 6  Pain Location: L hip with OOB Pain Descriptors / Indicators: Aching;Sore Pain Intervention(s): Limited activity within patient's tolerance;Monitored during session;Premedicated before session;Ice applied    Home Living                      Prior Function            PT Goals (current goals can now be found in the care plan section) Acute Rehab PT Goals Patient Stated Goal: Rehab at [facility] and then home PT Goal Formulation: With patient Time For Goal Achievement: 02/09/20 Potential to Achieve Goals: Good Progress towards PT goals: Progressing toward goals    Frequency    7X/week      PT Plan Current plan remains appropriate    Co-evaluation  AM-PAC PT "6 Clicks" Mobility   Outcome Measure  Help needed turning from your back to your side while in a flat bed without using bedrails?: A Lot Help needed moving from lying on your back to sitting on the side of a flat bed without using bedrails?: A Lot Help needed moving to and from a bed to a chair (including a wheelchair)?: A Lot Help needed standing up from a chair using your arms (e.g., wheelchair or bedside chair)?: A Lot Help needed to walk in hospital room?: A Lot Help needed climbing 3-5 steps with a railing? : A Lot 6 Click Score: 12    End of Session Equipment Utilized During  Treatment: Gait belt Activity Tolerance: Patient tolerated treatment well Patient left: in chair;with call bell/phone within reach;with chair alarm set Nurse Communication: Mobility status;Other (comment) PT Visit Diagnosis: Difficulty in walking, not elsewhere classified (R26.2)     Time: 8250-5397 PT Time Calculation (min) (ACUTE ONLY): 28 min  Charges:  $Gait Training: 8-22 mins $Therapeutic Exercise: 8-22 mins                     [employer] Pager [phone] Office [phone]    Anes Rigel 02/03/2020, 5:15 PM

## 2020-02-04 LAB — CBC
HCT: 24.4 % — ABNORMAL LOW (ref 36.0–46.0)
Hemoglobin: 8.1 g/dL — ABNORMAL LOW (ref 12.0–15.0)
MCH: 32.3 pg (ref 26.0–34.0)
MCHC: 33.2 g/dL (ref 30.0–36.0)
MCV: 97.2 fL (ref 80.0–100.0)
Platelets: 296 10*3/uL (ref 150–400)
RBC: 2.51 MIL/uL — ABNORMAL LOW (ref 3.87–5.11)
RDW: 13.6 % (ref 11.5–15.5)
WBC: 15.5 10*3/uL — ABNORMAL HIGH (ref 4.0–10.5)
nRBC: 0 % (ref 0.0–0.2)

## 2020-02-04 MED ORDER — TRAMADOL HCL 50 MG PO TABS: 50.0000 mg | ORAL_TABLET | Freq: Four times a day (QID) | ORAL | Status: DC | PRN

## 2020-02-04 NOTE — Discharge Instructions (Signed)

## 2020-02-04 NOTE — Progress Notes (Signed)
Subjective: 2 Days Post-Op Procedure(s) (LRB): LEFT TOTAL HIP ARTHROPLASTY ANTERIOR APPROACH (Left) Patient reports pain as moderate.  She states that her hearing has improved today compared to yesterday.  Still with asymptomatic acute blood loss anemia.  Objective: Vital signs in last 24 hours: Temp:  [98.7 F (37.1 C)-99.3 F (37.4 C)] 98.7 F (37.1 C) (06/27 0443) Pulse Rate:  [83-96] 83 (06/27 0443) Resp:  [16-18] 16 (06/27 0443) BP: (124-146)/(46-54) 124/49 (06/27 0443) SpO2:  [96 %-99 %] 96 % (06/27 0443)  Intake/Output from previous day: 06/26 0701 - 06/27 0700 In: 200.9 [I.V.:200.9] Out: 250 [Urine:250] Intake/Output this shift: No intake/output data recorded.  Recent Labs    02/03/20 0324 02/04/20 0404  HGB 8.6* 8.1*   Recent Labs    02/03/20 0324 02/04/20 0404  WBC 14.3* 15.5*  RBC 2.70* 2.51*  HCT 26.0* 24.4*  PLT 291 296   Recent Labs    02/03/20 0324  NA 127*  K 3.5  CL 96*  CO2 22  BUN 18  CREATININE 0.59  GLUCOSE 107*  CALCIUM 7.6*   No results for input(s): LABPT, INR in the last 72 hours.  Sensation intact distally Intact pulses distally Dorsiflexion/Plantar flexion intact Incision: dressing C/D/I   Assessment/Plan: 2 Days Post-Op Procedure(s) (LRB): LEFT TOTAL HIP ARTHROPLASTY ANTERIOR APPROACH (Left) Up with therapy Discharge to SNF Monday or Tuesday depending on bed availability and Hgb/Hct.      Kathryne Hitch 02/04/2020, 8:58 AM

## 2020-02-04 NOTE — Progress Notes (Signed)
Physical Therapy Treatment Patient Details Name: Susan Spencer MRN: [id number] DOB: 04/24/1930 Today's Date: 02/04/2020    History of Present Illness Pt s/p L THR and with hx of PVD, ostopenia, MVP and Breast CA s/p L mastectomy    PT Comments    Pt progressing slowly but steadily with mobility and this date ambulated increased distance in hall as well as performing therex program with assist.  Pt agreeable to up in chair but only for 1 hr.   Follow Up Recommendations  SNF     Equipment Recommendations  None recommended by PT    Recommendations for Other Services       Precautions / Restrictions Precautions Precautions: Fall Restrictions Other Position/Activity Restrictions: WBAT    Mobility  Bed Mobility Overal bed mobility: Needs Assistance Bed Mobility: Supine to Sit     Supine to sit: Min assist;Mod assist     General bed mobility comments: cues for sequence and use of R LE to self assist.   Transfers Overall transfer level: Needs assistance Equipment used: Rolling walker (2 wheeled) Transfers: Sit to/from Stand Sit to Stand: Min assist;From elevated surface         General transfer comment: cues for LE management and use of UEs to self assist; physical assist to bring wt up and fwd and to balance in intial standing  Ambulation/Gait Ambulation/Gait assistance: Min assist Gait Distance (Feet): 40 Feet Assistive device: Rolling walker (2 wheeled) Gait Pattern/deviations: Step-to pattern;Decreased step length - right;Decreased step length - left;Shuffle;Trunk flexed Gait velocity: decr   General Gait Details: cues for sequence, posture and position from [employer]             Wheelchair Mobility    Modified Rankin (Stroke Patients Only)       Balance Overall balance assessment: Needs assistance Sitting-balance support: No upper extremity supported;Feet supported Sitting balance-Leahy Scale: Good     Standing balance support: Bilateral  upper extremity supported Standing balance-Leahy Scale: Poor                              Cognition Arousal/Alertness: Awake/alert Behavior During Therapy: WFL for tasks assessed/performed Overall Cognitive Status: Within Functional Limits for tasks assessed                                        Exercises Total Joint Exercises Ankle Circles/Pumps: AROM;Both;15 reps;Supine Quad Sets: AROM;Both;10 reps;Supine Heel Slides: AAROM;Left;Supine;20 reps Hip ABduction/ADduction: AAROM;Left;15 reps;Supine    General Comments        Pertinent Vitals/Pain Pain Assessment: 0-10 Pain Score: 4  Pain Location: L hip with OOB Pain Descriptors / Indicators: Aching;Sore Pain Intervention(s): Limited activity within patient's tolerance;Monitored during session;Premedicated before session;Ice applied    Home Living                      Prior Function            PT Goals (current goals can now be found in the care plan section) Acute Rehab PT Goals Patient Stated Goal: Rehab at [facility] and then home PT Goal Formulation: With patient Time For Goal Achievement: 02/09/20 Potential to Achieve Goals: Good Progress towards PT goals: Progressing toward goals    Frequency    7X/week      PT Plan Current plan remains appropriate  Co-evaluation              AM-PAC PT "6 Clicks" Mobility   Outcome Measure  Help needed turning from your back to your side while in a flat bed without using bedrails?: A Lot Help needed moving from lying on your back to sitting on the side of a flat bed without using bedrails?: A Lot Help needed moving to and from a bed to a chair (including a wheelchair)?: A Lot Help needed standing up from a chair using your arms (e.g., wheelchair or bedside chair)?: A Little Help needed to walk in hospital room?: A Little Help needed climbing 3-5 steps with a railing? : A Lot 6 Click Score: 14    End of Session Equipment  Utilized During Treatment: Gait belt Activity Tolerance: Patient tolerated treatment well Patient left: in chair;with call bell/phone within reach;with chair alarm set Nurse Communication: Mobility status;Other (comment) PT Visit Diagnosis: Difficulty in walking, not elsewhere classified (R26.2)     Time: 5800-6349 PT Time Calculation (min) (ACUTE ONLY): 24 min  Charges:  $Gait Training: 8-22 mins $Therapeutic Exercise: 8-22 mins                     Mauro Kaufmann PT Acute Rehabilitation Services Pager [phone] Office [phone]    [place] 02/04/2020, 3:19 PM

## 2020-02-04 NOTE — Progress Notes (Signed)
Dr. Magnus Ivan notified of pt and family concern for hearing issues this visit. Pt reports her hearing got worse after getting her pain medication. Dr. Magnus Ivan did change pt pain medication based on this conversation.

## 2020-02-04 NOTE — Progress Notes (Signed)
Physical Therapy Treatment Patient Details Name: Susan Spencer MRN: [id number] DOB: 10-04-1929 Today's Date: 02/04/2020    History of Present Illness Pt s/p L THR and with hx of PVD, ostopenia, MVP and Breast CA s/p L mastectomy    PT Comments    Pt continues cooperative and up to ambulate short distance in hall before being assisted to bed after agreed upon time up in chair.   Follow Up Recommendations  SNF     Equipment Recommendations  None recommended by PT    Recommendations for Other Services       Precautions / Restrictions Precautions Precautions: Fall Restrictions Other Position/Activity Restrictions: WBAT    Mobility  Bed Mobility Overal bed mobility: Needs Assistance Bed Mobility: Supine to Sit     Supine to sit: Min assist;Mod assist Sit to supine: Min assist;Mod assist   General bed mobility comments: cues for sequence and use of R LE to self assist.   Transfers Overall transfer level: Needs assistance Equipment used: Rolling walker (2 wheeled) Transfers: Sit to/from Stand Sit to Stand: Min assist         General transfer comment: cues for LE management and use of UEs to self assist; physical assist to bring wt up and fwd and to balance in intial standing  Ambulation/Gait Ambulation/Gait assistance: Min assist Gait Distance (Feet): 18 Feet Assistive device: Rolling walker (2 wheeled) Gait Pattern/deviations: Step-to pattern;Decreased step length - right;Decreased step length - left;Shuffle;Trunk flexed Gait velocity: decr   General Gait Details: cues for sequence, posture and position from [employer]             Wheelchair Mobility    Modified Rankin (Stroke Patients Only)       Balance Overall balance assessment: Needs assistance Sitting-balance support: No upper extremity supported;Feet supported Sitting balance-Leahy Scale: Good     Standing balance support: Bilateral upper extremity supported Standing balance-Leahy  Scale: Poor                              Cognition Arousal/Alertness: Awake/alert Behavior During Therapy: WFL for tasks assessed/performed Overall Cognitive Status: Within Functional Limits for tasks assessed                                        Exercises Total Joint Exercises Ankle Circles/Pumps: AROM;Both;15 reps;Supine Quad Sets: AROM;Both;10 reps;Supine Heel Slides: AAROM;Left;Supine;20 reps Hip ABduction/ADduction: AAROM;Left;15 reps;Supine    General Comments        Pertinent Vitals/Pain Pain Assessment: 0-10 Pain Score: 4  Pain Location: L hip with OOB Pain Descriptors / Indicators: Aching;Sore Pain Intervention(s): Limited activity within patient's tolerance;Monitored during session;Premedicated before session;Ice applied    Home Living                      Prior Function            PT Goals (current goals can now be found in the care plan section) Acute Rehab PT Goals Patient Stated Goal: Rehab at [facility] and then home PT Goal Formulation: With patient Time For Goal Achievement: 02/09/20 Potential to Achieve Goals: Good Progress towards PT goals: Progressing toward goals    Frequency    7X/week      PT Plan Current plan remains appropriate    Co-evaluation  AM-PAC PT "6 Clicks" Mobility   Outcome Measure  Help needed turning from your back to your side while in a flat bed without using bedrails?: A Lot Help needed moving from lying on your back to sitting on the side of a flat bed without using bedrails?: A Lot Help needed moving to and from a bed to a chair (including a wheelchair)?: A Lot Help needed standing up from a chair using your arms (e.g., wheelchair or bedside chair)?: A Little Help needed to walk in hospital room?: A Little Help needed climbing 3-5 steps with a railing? : A Lot 6 Click Score: 14    End of Session Equipment Utilized During Treatment: Gait belt Activity  Tolerance: Patient tolerated treatment well Patient left: in bed;with bed alarm set;with call bell/phone within reach Nurse Communication: Mobility status;Other (comment) PT Visit Diagnosis: Difficulty in walking, not elsewhere classified (R26.2)     Time: 1300-1315 PT Time Calculation (min) (ACUTE ONLY): 15 min  Charges:  $Gait Training: 8-22 mins $Therapeutic Exercise: 8-22 mins                     [place] Pager [phone] Office [phone]    Susan Spencer 02/04/2020, 3:22 PM

## 2020-02-04 NOTE — Plan of Care (Signed)
  Problem: Activity: Goal: Risk for activity intolerance will decrease Outcome: Progressing   Problem: Nutrition: Goal: Adequate nutrition will be maintained Outcome: Progressing   Problem: Coping: Goal: Level of anxiety will decrease Outcome: Progressing   Problem: Clinical Measurements: Goal: Respiratory complications will improve Outcome: Progressing   Problem: Clinical Measurements: Goal: Cardiovascular complication will be avoided Outcome: Progressing   Problem: Safety: Goal: Ability to remain free from injury will improve Outcome: Progressing

## 2020-02-04 NOTE — Plan of Care (Signed)
Plan of care reviewed and discussed with the patient. 

## 2020-02-05 ENCOUNTER — Encounter (HOSPITAL_COMMUNITY): Payer: Self-pay | Admitting: Orthopaedic Surgery

## 2020-02-05 LAB — CBC
HCT: 22.9 % — ABNORMAL LOW (ref 36.0–46.0)
Hemoglobin: 7.7 g/dL — ABNORMAL LOW (ref 12.0–15.0)
MCH: 32.2 pg (ref 26.0–34.0)
MCHC: 33.6 g/dL (ref 30.0–36.0)
MCV: 95.8 fL (ref 80.0–100.0)
Platelets: 288 10*3/uL (ref 150–400)
RBC: 2.39 MIL/uL — ABNORMAL LOW (ref 3.87–5.11)
RDW: 13.5 % (ref 11.5–15.5)
WBC: 13 10*3/uL — ABNORMAL HIGH (ref 4.0–10.5)
nRBC: 0 % (ref 0.0–0.2)

## 2020-02-05 LAB — SARS CORONAVIRUS 2 (TAT 6-24 HRS): SARS Coronavirus 2: NEGATIVE

## 2020-02-05 MED ORDER — TRAMADOL HCL 50 MG PO TABS: 50.0000 mg | ORAL_TABLET | Freq: Four times a day (QID) | ORAL | 0 refills | Status: DC | PRN

## 2020-02-05 NOTE — TOC Transition Note (Signed)
Transition of Care [facility]) - CM/SW Discharge Note   Patient Details  Name: LIZZETTE CARBONELL MRN: [id number] Date of Birth: 1930-03-11  Transition of Care [facility]) CM/SW Contact:  Amada Jupiter, LCSW Phone Number: 02/05/2020, 3:49 PM   Clinical Narrative:   Pt ready for dc.  COVID neg. Pt and son aware ambulance has been called.    Final next level of care: Skilled Nursing Facility Barriers to Discharge: Barriers Resolved   Patient Goals and CMS Choice Patient states their goals for this hospitalization and ongoing recovery are:: short term at SNF then [place]      Discharge Placement              Patient chooses bed at: [facility], [place] Patient to be transferred to facility by: [facility] Name of family member notified: son, Ghadeer Kastelic @ [phone] Patient and family notified of of transfer: 02/05/20  Discharge Plan and Services                DME Arranged: N/A DME Agency: NA       HH Arranged: NA HH Agency: NA        Social Determinants of Health (SDOH) Interventions     Readmission Risk Interventions No flowsheet data found.

## 2020-02-05 NOTE — TOC Progression Note (Signed)
Transition of Care [facility]) - Progression Note    Patient Details  Name: Susan Spencer MRN: [id number] Date of Birth: 04-14-1930  [facility]) CM/SW Contact  Lennart Pall, LCSW Phone Number: 02/05/2020, 12:31 PM  Clinical Narrative:   Met with pt this morning to discuss d/c plans.  [place] living in [place] apt at [facility] in [place].  She prefers to go to their SNF area for rehab prior to return to apt.  Have spoken with admissions coordinator who notes they do have a bed available.  Have alerted MD and await COVID test result prior to transfer.  Hope to d/c today.      Barriers to Discharge: Continued Medical Work up  Expected Discharge Plan and Services           Expected Discharge Date: 02/05/20               DME Arranged: N/A DME Agency: NA       HH Arranged: NA HH Agency: NA         Social Determinants of Health (SDOH) Interventions    Readmission Risk Interventions No flowsheet data found.

## 2020-02-05 NOTE — Progress Notes (Signed)
Physical Therapy Treatment Patient Details Name: Susan Spencer MRN: [id number] DOB: February 17, 1930 Today's Date: 02/05/2020    History of Present Illness Pt s/p L THR and with hx of PVD, ostopenia, MVP and Breast CA s/p L mastectomy    PT Comments    POD # 3  Assisted OOB to amb in hallway then Then returned to room to perform some TE's following HEP handout.  Instructed on proper tech, freq as well as use of ICE.     Follow Up Recommendations  SNF     Equipment Recommendations  None recommended by PT    Recommendations for Other Services       Precautions / Restrictions Precautions Precautions: Fall Restrictions Weight Bearing Restrictions: No LLE Weight Bearing: Weight bearing as tolerated Other Position/Activity Restrictions: WBAT    Mobility  Bed Mobility Overal bed mobility: Needs Assistance Bed Mobility: Supine to Sit     Supine to sit: Min assist     General bed mobility comments: demonstarted and instructed how to use a belt to self assist LE off bed  Transfers Overall transfer level: Needs assistance Equipment used: Rolling walker (2 wheeled) Transfers: Sit to/from [employer] Sit to Stand: Min assist Stand pivot transfers: Min assist       General transfer comment: 25% VC's on proper hand placement and safety with turns as well as increased time  Ambulation/Gait Ambulation/Gait assistance: Min assist Gait Distance (Feet): 23 Feet Assistive device: Rolling walker (2 wheeled) Gait Pattern/deviations: Step-to pattern;Decreased step length - right;Decreased step length - left;Shuffle;Trunk flexed Gait velocity: decreased   General Gait Details: 25% cues for sequence, posture and position from RW as well as increased time   Stairs             Wheelchair Mobility    Modified Rankin (Stroke Patients Only)       Balance                                            Cognition Arousal/Alertness:  Awake/alert   Overall Cognitive Status: Within Functional Limits for tasks assessed                                 General Comments: pleasant      Exercises      General Comments        Pertinent Vitals/Pain Pain Score: 5  Pain Location: L hip with TE's Pain Descriptors / Indicators: Aching;Sore Pain Intervention(s): Monitored during session;Premedicated before session;Repositioned;Ice applied    Home Living                      Prior Function            PT Goals (current goals can now be found in the care plan section) Progress towards PT goals: Progressing toward goals    Frequency    7X/week      PT Plan Current plan remains appropriate    Co-evaluation              AM-PAC PT "6 Clicks" Mobility   Outcome Measure  Help needed turning from your back to your side while in a flat bed without using bedrails?: A Little Help needed moving from lying on your back to sitting on the side of a flat  bed without using bedrails?: A Little Help needed moving to and from a bed to a chair (including a wheelchair)?: A Little Help needed standing up from a chair using your arms (e.g., wheelchair or bedside chair)?: A Little Help needed to walk in hospital room?: A Little Help needed climbing 3-5 steps with a railing? : A Little 6 Click Score: 18    End of Session Equipment Utilized During Treatment: Gait belt Activity Tolerance: Patient tolerated treatment well Patient left: in chair;with call bell/phone within reach Nurse Communication: Mobility status PT Visit Diagnosis: Difficulty in walking, not elsewhere classified (R26.2)     Time: 9090-3014 PT Time Calculation (min) (ACUTE ONLY): 25 min  Charges:  $Gait Training: 8-22 mins $Therapeutic Activity: 8-22 mins                     Felecia Shelling  PTA Acute  Rehabilitation Services Pager      [phone] Office      [phone]

## 2020-02-05 NOTE — Progress Notes (Signed)
Patient ID: Susan Spencer, female   DOB: [birth year], 86 y.o.   MRN: [id number] The patient reports that her hearing has continued to improve.  It is closer to her baseline.  She is now just taking tramadol for pain.  She denies any headache.  She denies any lightheadedness or fatigue.  Her hemoglobin is down to 7.7 but she has asymptomatic with her acute blood loss anemia.  Her left operative hip is stable.  From an orthopedic standpoint, she can be discharged to skilled nursing this afternoon if a bed is available.

## 2020-02-05 NOTE — Discharge Summary (Signed)
Patient ID: Susan Spencer MRN: [id number] DOB/AGE: 18-Mar-1930 86 y.o.  Admit date: 02/02/2020 Discharge date: 02/05/2020  Admission Diagnoses:  Principal Problem:   Unilateral primary osteoarthritis, left hip Active Problems:   Status post total replacement of left hip   Discharge Diagnoses:  Same  Past Medical History:  Diagnosis Date   Anxiety disorder 10/28/2015   Carotid occlusion, bilateral 07/12/2017   left distal ICA is tortuous, increased velocity is seen     Degeneration of lumbar intervertebral disc 10/28/2015   Depression, major, recurrent ([place]) 10/28/2015   Dyspnea    anxiety   Essential hypertension 06/18/2015   GERD (gastroesophageal reflux disease) 10/28/2015   Heart murmur    High risk medication use 10/28/2015   History of breast cancer 05/05/2016   IBS (irritable bowel syndrome) 10/28/2015   Malaise and fatigue 05/05/2016   Mitral valve disease 10/28/2015   Overview:  Mild mitral regurg.   Mixed hyperlipidemia 10/28/2015   Occlusion and stenosis of bilateral carotid arteries 10/28/2015   Osteopenia 10/28/2015   Prediabetes 10/28/2015   Primary osteoarthritis involving multiple joints 10/28/2015   PVD (peripheral vascular disease) ([place]) 10/29/2015   Overview:  Carotid.  50-69 Lreft 03/2014. [place]   Spondylosis 10/28/2015    Surgeries: Procedure(s): LEFT TOTAL HIP ARTHROPLASTY ANTERIOR APPROACH on 02/02/2020   Consultants:   Discharged Condition: Improved  Hospital Course: MELAINA HOWERTON is an 86 y.o. female who was admitted 02/02/2020 for operative treatment ofUnilateral primary osteoarthritis, left hip. Patient has severe unremitting pain that affects sleep, daily activities, and work/hobbies. After pre-op clearance the patient was taken to the operating room on 02/02/2020 and underwent  Procedure(s): LEFT TOTAL HIP ARTHROPLASTY ANTERIOR APPROACH.    Patient was given perioperative antibiotics:  Anti-infectives (From admission, onward)   Start      Dose/Rate Route Frequency Ordered Stop   02/02/20 1400  ceFAZolin (ANCEF) IVPB 1 g/50 mL premix        1 g 100 mL/hr over 30 Minutes Intravenous Every 6 hours 02/02/20 1149 02/02/20 1952   02/02/20 0630  ceFAZolin (ANCEF) IVPB 2g/100 mL premix        2 g 200 mL/hr over 30 Minutes Intravenous On call to O.R. 02/02/20 2376 02/02/20 2831       Patient was given sequential compression devices, early ambulation, and chemoprophylaxis to prevent DVT.  Patient benefited maximally from hospital stay and there were no complications.    Recent vital signs:  Patient Vitals for the past 24 hrs:  BP Temp Temp src Pulse Resp SpO2  02/05/20 0602 (!) 135/54 98.4 F (36.9 C) -- 87 18 98 %  02/04/20 2135 (!) 142/54 99.2 F (37.3 C) -- 94 16 97 %  02/04/20 1345 (!) 129/52 98.9 F (37.2 C) Oral 85 14 98 %     Recent laboratory studies:  Recent Labs    02/03/20 0324 02/03/20 0324 02/04/20 0404 02/05/20 0402  WBC 14.3*   < > 15.5* 13.0*  HGB 8.6*   < > 8.1* 7.7*  HCT 26.0*   < > 24.4* 22.9*  PLT 291   < > 296 288  NA 127*  --   --   --   K 3.5  --   --   --   CL 96*  --   --   --   CO2 22  --   --   --   BUN 18  --   --   --   CREATININE 0.59  --   --   --  GLUCOSE 107*  --   --   --   CALCIUM 7.6*  --   --   --    < > = values in this interval not displayed.     Discharge Medications:   Allergies as of 02/05/2020   No Known Allergies     Medication List    STOP taking these medications   aspirin EC 81 MG tablet     TAKE these medications   acetaminophen 500 MG tablet Commonly known as: TYLENOL Take 1,000 mg by mouth in the morning and at bedtime.   calcium-vitamin D 500-200 MG-UNIT tablet Commonly known as: OSCAL WITH D Take 1 tablet by mouth 2 (two) times daily.   dorzolamide-timolol 22.3-6.8 MG/ML ophthalmic solution Commonly known as: COSOPT Place 1 drop into both eyes 2 (two) times daily.   famotidine 40 MG tablet Commonly known as: PEPCID Take 20 mg by mouth  daily as needed (heartburn/indigestion.).   Fish Oil 1000 MG Caps Take 1,000 mg by mouth daily at 12 noon.   loratadine 10 MG tablet Commonly known as: CLARITIN Take 10 mg by mouth daily as needed for allergies.   Lumigan 0.01 % Soln Generic drug: bimatoprost Place 1 drop into both eyes at bedtime.   metoprolol succinate 50 MG 24 hr tablet Commonly known as: TOPROL-XL Take 50 mg by mouth daily with supper.   multivitamin with minerals Tabs tablet Take 1 tablet by mouth daily at 12 noon.   pravastatin 20 MG tablet Commonly known as: PRAVACHOL Take 1 tablet (20 mg total) by mouth every other day. What changed: additional instructions   PROBIOTIC DAILY PO Take 1 capsule by mouth daily.   raloxifene 60 MG tablet Commonly known as: EVISTA Take 60 mg by mouth every evening.   sertraline 50 MG tablet Commonly known as: ZOLOFT Take 50 mg by mouth daily.   spironolactone-hydrochlorothiazide 25-25 MG tablet Commonly known as: ALDACTAZIDE TAKE 1/2 TABLET BY MOUTH DAILY. What changed:   how much to take  how to take this  when to take this  additional instructions   traMADol 50 MG tablet Commonly known as: ULTRAM Take 1-2 tablets (50-100 mg total) by mouth every 6 (six) hours as needed for moderate pain or severe pain.   traZODone 50 MG tablet Commonly known as: DESYREL Take 25 mg by mouth as directed.            Durable Medical Equipment  (From admission, onward)         Start     Ordered   02/02/20 1150  DME 3 n 1  Once        02/02/20 1149   02/02/20 1150  DME Walker rolling  Once       Question Answer Comment  Walker: With 5 Inch Wheels   Patient needs a walker to treat with the following condition Status post total replacement of left hip      02/02/20 1149          Diagnostic Studies: DG Pelvis Portable  Result Date: 02/02/2020 CLINICAL DATA:  Status post left total hip arthroplasty. EXAM: PORTABLE PELVIS 1-2 VIEWS COMPARISON:  12/27/2019  FINDINGS: Interval left total hip arthroplasty. Hardware components are in anatomic alignment. No periprosthetic fracture or dislocation. Gas is identified within the soft tissues overlying the left hip. IMPRESSION: Status post left total hip arthroplasty. Electronically Signed   By: Signa Kell M.D.   On: 02/02/2020 12:37   DG C-Arm 1-60 Min-No Report  Result Date: 02/02/2020 Fluoroscopy was utilized by the requesting physician.  No radiographic interpretation.   DG HIP OPERATIVE UNILAT W OR W/O PELVIS LEFT  Result Date: 02/02/2020 CLINICAL DATA:  Left hip replacement. EXAM: OPERATIVE left HIP (WITH PELVIS IF PERFORMED) 5 VIEWS TECHNIQUE: Fluoroscopic spot image(s) were submitted for interpretation post-operatively. FLUOROSCOPY TIME:  18 seconds. COMPARISON:  Dec 27, 2019. FINDINGS: Five intraoperative fluoroscopic images were obtained of the left hip. The left femoral and acetabular components appear to be well situated. Expected postoperative changes are noted in the surrounding soft tissues. IMPRESSION: Status post left total hip arthroplasty. Electronically Signed   By: Lupita Raider M.D.   On: 02/02/2020 10:20    Disposition: Discharge disposition: 03-Skilled Nursing Facility          Follow-up Information    Kathryne Hitch, MD Follow up in 2 week(s).   Specialty: Orthopedic Surgery Contact information: [address] [place] [address] [phone]                Signed: Kathryne Hitch 02/05/2020, 7:25 AM

## 2020-02-05 NOTE — NC FL2 (Signed)
[place] LEVEL OF CARE SCREENING TOOL     IDENTIFICATION  Patient Name: Susan Spencer Birthdate: October 04, 1929 Sex: female Admission Date (Current Location): 02/02/2020  [facility] and [place] Number:  [place] and Address:  [facility],  [address] [place], [place]      Provider Number: [id number]  Attending Physician Name and Address:  Mcarthur Rossetti  Relative Name and Phone Number:  son, Veryl Abril @ [phone]    Current Level of Care: Hospital Recommended Level of Care: [place] Prior Approval Number:    Date Approved/Denied:   PASRR Number: [id number]  Discharge Plan: SNF    Current Diagnoses: Patient Active Problem List   Diagnosis Date Noted  . Status post total replacement of left hip 02/02/2020  . Unilateral primary osteoarthritis, left hip 12/27/2019  . Carotid occlusion, bilateral 07/12/2017  . History of breast cancer 05/05/2016  . Malaise and fatigue 05/05/2016  . PVD (peripheral vascular disease) ([place]) 10/29/2015  . Anxiety disorder 10/28/2015  . Degeneration of lumbar intervertebral disc 10/28/2015  . Depression, major, recurrent ([place]) 10/28/2015  . GERD (gastroesophageal reflux disease) 10/28/2015  . High risk medication use 10/28/2015  . Mixed hyperlipidemia 10/28/2015  . IBS (irritable bowel syndrome) 10/28/2015  . Mitral valve disease 10/28/2015  . Occlusion and stenosis of bilateral carotid arteries 10/28/2015  . Osteopenia 10/28/2015  . Prediabetes 10/28/2015  . Primary osteoarthritis involving multiple joints 10/28/2015  . Spondylosis 10/28/2015  . Hypertensive heart disease with heart failure ([place]) 06/18/2015    Orientation RESPIRATION BLADDER Height & Weight     Self, Time, Situation, Place  Normal Continent Weight: 115 lb 15.4 oz (52.6 kg) Height:  5\' 5"  (165.1 cm)  BEHAVIORAL SYMPTOMS/MOOD NEUROLOGICAL BOWEL NUTRITION STATUS      Continent    AMBULATORY STATUS  COMMUNICATION OF NEEDS Skin   Limited Assist Verbally Surgical wounds                       Personal Care Assistance Level of Assistance  Bathing, Dressing Bathing Assistance: Limited assistance   Dressing Assistance: Limited assistance     Functional Limitations Info             SPECIAL CARE FACTORS FREQUENCY  PT (By licensed PT), OT (By licensed OT)     PT Frequency: 5x/wk OT Frequency: 5x/wk            Contractures Contractures Info: Not present    Additional Factors Info  Code Status, Allergies Code Status Info: Full Allergies Info: NKDA           Current Medications (02/05/2020):  This is the current hospital active medication list Current Facility-Administered Medications  Medication Dose Route Frequency Provider Last Rate Last Admin  . 0.9 %  sodium chloride infusion   Intravenous Continuous Mcarthur Rossetti, MD   Stopped at 02/03/20 [phone]  . acetaminophen (TYLENOL) tablet 325-650 mg  325-650 mg Oral Q6H PRN Mcarthur Rossetti, MD   650 mg at 02/02/20 1718  . alum & mag hydroxide-simeth (MAALOX/MYLANTA) 200-200-20 MG/5ML suspension 30 mL  30 mL Oral Q4H PRN Mcarthur Rossetti, MD      . aspirin chewable tablet 81 mg  81 mg Oral BID Mcarthur Rossetti, MD   81 mg at 02/05/20 0076  . calcium-vitamin D (OSCAL WITH D) 500-200 MG-UNIT per tablet 1 tablet  1 tablet Oral BID WC Mcarthur Rossetti, MD   1  tablet at 02/05/20 0927  . diphenhydrAMINE (BENADRYL) 12.5 MG/5ML elixir 12.5-25 mg  12.5-25 mg Oral Q4H PRN Kathryne Hitch, MD      . docusate sodium (COLACE) capsule 100 mg  100 mg Oral BID Kathryne Hitch, MD   100 mg at 02/05/20 8502  . dorzolamide-timolol (COSOPT) 22.3-6.8 MG/ML ophthalmic solution 1 drop  1 drop Both Eyes BID Kathryne Hitch, MD   1 drop at 02/05/20 0930  . famotidine (PEPCID) tablet 20 mg  20 mg Oral Daily PRN Kathryne Hitch, MD   20 mg at 02/04/20 1258  . spironolactone  (ALDACTONE) tablet 12.5 mg  12.5 mg Oral Daily Kathryne Hitch, MD   12.5 mg at 02/05/20 7741   And  . hydrochlorothiazide (MICROZIDE) capsule 12.5 mg  12.5 mg Oral Daily Kathryne Hitch, MD   12.5 mg at 02/05/20 2878  . latanoprost (XALATAN) 0.005 % ophthalmic solution 1 drop  1 drop Both Eyes QHS Kathryne Hitch, MD   1 drop at 02/04/20 2143  . loratadine (CLARITIN) tablet 10 mg  10 mg Oral Daily PRN Kathryne Hitch, MD   10 mg at 02/03/20 1126  . loratadine (CLARITIN) tablet 10 mg  10 mg Oral Daily Kirtland Bouchard, PA-C   10 mg at 02/05/20 6767  . menthol-cetylpyridinium (CEPACOL) lozenge 3 mg  1 lozenge Oral PRN Kathryne Hitch, MD       Or  . phenol (CHLORASEPTIC) mouth spray 1 spray  1 spray Mouth/Throat PRN Kathryne Hitch, MD      . methocarbamol (ROBAXIN) tablet 500 mg  500 mg Oral Q6H PRN Kathryne Hitch, MD   500 mg at 02/04/20 1743   Or  . methocarbamol (ROBAXIN) 500 mg in dextrose 5 % 50 mL IVPB  500 mg Intravenous Q6H PRN Kathryne Hitch, MD      . metoCLOPramide (REGLAN) tablet 5-10 mg  5-10 mg Oral Q8H PRN Kathryne Hitch, MD   5 mg at 02/04/20 1258   Or  . metoCLOPramide (REGLAN) injection 5-10 mg  5-10 mg Intravenous Q8H PRN Kathryne Hitch, MD   10 mg at 02/02/20 1806  . metoprolol succinate (TOPROL-XL) 24 hr tablet 50 mg  50 mg Oral Daily Kathryne Hitch, MD   50 mg at 02/05/20 2094  . morphine 2 MG/ML injection 0.5-1 mg  0.5-1 mg Intravenous Q2H PRN Kathryne Hitch, MD      . multivitamin with minerals tablet 1 tablet  1 tablet Oral Q1200 Kathryne Hitch, MD      . ondansetron [facility]) tablet 4 mg  4 mg Oral Q6H PRN Kathryne Hitch, MD   4 mg at 02/04/20 0901   Or  . ondansetron (ZOFRAN) injection 4 mg  4 mg Intravenous Q6H PRN Kathryne Hitch, MD   4 mg at 02/02/20 1342  . raloxifene (EVISTA) tablet 60 mg  60 mg Oral QPM Kathryne Hitch, MD   60  mg at 02/04/20 1742  . sertraline (ZOLOFT) tablet 50 mg  50 mg Oral Daily Kathryne Hitch, MD   50 mg at 02/05/20 7096  . traMADol (ULTRAM) tablet 50-100 mg  50-100 mg Oral Q6H PRN Kathryne Hitch, MD         Discharge Medications: Please see discharge summary for a list of discharge medications.  Relevant Imaging Results:  Relevant Lab Results:   Additional Information SS# [id number]  Amada Jupiter, LCSW

## 2020-02-05 NOTE — Progress Notes (Signed)
The patient is alert and oriented and has been seen by her physician. The orders for discharge were written. IV was removed on day shift. Discharge instructions had already been reviewed with the patient and family on day shift. Report was called to facility on day shift. Patient is being discharged via PTAR/stretcher with all of her belongings.

## 2020-02-19 ENCOUNTER — Encounter: Payer: Self-pay | Admitting: Physician Assistant

## 2020-02-19 ENCOUNTER — Ambulatory Visit (INDEPENDENT_AMBULATORY_CARE_PROVIDER_SITE_OTHER): Payer: Medicare Other | Admitting: Physician Assistant

## 2020-02-19 ENCOUNTER — Other Ambulatory Visit: Payer: Self-pay

## 2020-02-19 DIAGNOSIS — Z96642 Presence of left artificial hip joint: Secondary | ICD-10-CM

## 2020-02-19 NOTE — Progress Notes (Signed)
HPI: Susan Spencer returns today 2 weeks status post left total hip arthroplasty.  She remains in a skilled facility.  She states she is working with physical therapy and ambulating with a walker.  She has no complaints.  Physical exam: Left hip surgical incisions well approximated with staples no signs of wound dehiscence or infection.  Positive seroma.  Left calf supple nontender dorsiflexion plantarflexion left ankle intact.  She ambulates with a rolling walker with a slight antalgic gait.  Impression: Status post left total hip arthroplasty 02/02/2020  Plan: She will continue to work on range of motion strengthening left hip.  Left hip was prepped with Betadine aspiration of the left seroma was performed total of 40 cc of serous sanguinous fluid was obtained patient tolerated well.  Staples removed today Steri-Strips applied.  She is to work on scar tissue mobilization.  She is able to get the incision wet in the shower.

## 2020-02-28 ENCOUNTER — Telehealth: Payer: Self-pay | Admitting: Orthopaedic Surgery

## 2020-02-28 NOTE — Telephone Encounter (Signed)
IC verbal given.  

## 2020-02-28 NOTE — Telephone Encounter (Signed)
Received call from [facility] nurse with [facility] needing nursing orders to monitor patient medication -  Once a month for 2 months. The number to contact Archie Patten is [phone]

## 2020-03-18 ENCOUNTER — Ambulatory Visit: Payer: Medicare Other | Admitting: Physician Assistant

## 2020-03-28 ENCOUNTER — Encounter: Payer: Self-pay | Admitting: Physician Assistant

## 2020-03-28 ENCOUNTER — Ambulatory Visit (INDEPENDENT_AMBULATORY_CARE_PROVIDER_SITE_OTHER): Payer: Medicare Other | Admitting: Physician Assistant

## 2020-03-28 DIAGNOSIS — Z96642 Presence of left artificial hip joint: Secondary | ICD-10-CM

## 2020-03-28 NOTE — Progress Notes (Signed)
HPI : Mrs. Jaso  Is now approximately 6 weeks status post left total hip arthroplasty.  She states hip is doing well she does feel like she has a leg length discrepancy.  She is ambulating with a cane.  Otherwise she is happy with the results of the total hip arthroplasty.  Physical exam: Left hip good range of motion without pain.  She has very slight leg length discrepancy with the left leg being a few millimeters longer than the right leg.  Left calf supple nontender.  She ambulates without assistive device throughout the room and without an antalgic gait.  She does use a cane to ambulate out in the hallway. Surgical incisions healing well no signs of infection.  Impression: Status post left total hip arthroplasty 02/02/2020  Plan: She will continue to work on range of motion strengthening the left hip.  Scar tissue mobilization.  Follow-up with [place] in 3 months we will obtain an AP pelvis and lateral view of the left hip at that time.  Follow-up sooner if there is any questions concerns.

## 2020-04-19 DIAGNOSIS — R011 Cardiac murmur, unspecified: Secondary | ICD-10-CM | POA: Insufficient documentation

## 2020-04-19 DIAGNOSIS — R0600 Dyspnea, unspecified: Secondary | ICD-10-CM | POA: Insufficient documentation

## 2020-04-23 ENCOUNTER — Ambulatory Visit (INDEPENDENT_AMBULATORY_CARE_PROVIDER_SITE_OTHER): Payer: Medicare Other | Admitting: Cardiology

## 2020-04-23 ENCOUNTER — Other Ambulatory Visit: Payer: Self-pay

## 2020-04-23 ENCOUNTER — Encounter: Payer: Self-pay | Admitting: Cardiology

## 2020-04-23 VITALS — BP 168/70 | HR 80 | Ht 65.0 in | Wt 114.6 lb

## 2020-04-23 DIAGNOSIS — I110 Hypertensive heart disease with heart failure: Secondary | ICD-10-CM

## 2020-04-23 DIAGNOSIS — E782 Mixed hyperlipidemia: Secondary | ICD-10-CM | POA: Diagnosis not present

## 2020-04-23 DIAGNOSIS — I779 Disorder of arteries and arterioles, unspecified: Secondary | ICD-10-CM | POA: Diagnosis not present

## 2020-04-23 NOTE — Progress Notes (Signed)
Cardiology Office Note:    Date:  04/23/2020   ID:  Susan Spencer, DOB 1930/05/15, MRN [id number]  PCP:  Gordan Payment., MD  Cardiologist:  Norman Herrlich, MD    Referring MD: Gordan Payment., MD    ASSESSMENT:    1. Hypertensive heart disease with heart failure (HCC)   2. Mixed hyperlipidemia   3. Carotid artery disease, unspecified laterality, unspecified type (HCC)    PLAN:    In order of problems listed above:  1. Overall she is doing well BP is at target medications well-tolerated continue her combination hydrochlorothiazide distal diuretic along with beta-blocker. 2. Stable continue fish oil supplement lipid profile June 2021 [facility] is favorable cholesterol 146 LDL 79 triglycerides 64 HDL 56. 3. Stable asymptomatic no carotid bruit at this time I would not do imaging.   Next appointment: 1 year   Medication Adjustments/Labs and Tests Ordered: Current medicines are reviewed at length with the patient today.  Concerns regarding medicines are outlined above.  No orders of the defined types were placed in this encounter.  No orders of the defined types were placed in this encounter.   Chief Complaint  Patient presents with  . Follow-up  . Hypertension  . Congestive Heart Failure    History of Present Illness:    Susan Spencer is a 86 y.o. female with a hx of hypertension and heart failure last seen 04/20/2019. Compliance with diet, lifestyle and medications: Yes  Labs [facility] primary care 03/07/2020 show potassium 4.5 creatinine 0.68 GFR 79 cc normal liver function test she was anemic hemoglobin 10.7  I am sad to hear that her husband has passed.  He was not only a patient but was a friend of mine.  She is done well she is recovered from her hip surgery and lives independently at a senior facility.  She has had Covid vaccine and practices the 3 WS.  Her blood pressure is in range repeat by me 142/60 no edema shortness of breath chest pain  palpitation or syncope.  She asked about the combination of beta-blocker eyedrops and metoprolol I told her I do not think it is a problem in her case.  She had an EKG at the hospital but we cannot pull it off its not released. Past Medical History:  Diagnosis Date  . Anxiety disorder 10/28/2015  . Carotid occlusion, bilateral 07/12/2017   left distal ICA is tortuous, increased velocity is seen    . Degeneration of lumbar intervertebral disc 10/28/2015  . Depression, major, recurrent (HCC) 10/28/2015  . Dyspnea    anxiety  . Essential hypertension 06/18/2015  . GERD (gastroesophageal reflux disease) 10/28/2015  . Heart murmur   . High risk medication use 10/28/2015  . History of breast cancer 05/05/2016  . Hypertensive heart disease with heart failure (HCC) 06/18/2015  . IBS (irritable bowel syndrome) 10/28/2015  . Malaise and fatigue 05/05/2016  . Mitral valve disease 10/28/2015   Overview:  Mild mitral regurg.  . Mixed hyperlipidemia 10/28/2015  . Occlusion and stenosis of bilateral carotid arteries 10/28/2015  . Osteopenia 10/28/2015  . Prediabetes 10/28/2015  . Primary osteoarthritis involving multiple joints 10/28/2015  . PVD (peripheral vascular disease) (HCC) 10/29/2015   Overview:  Carotid.  50-69 Lreft 03/2014. Avia Merkley  . Spondylosis 10/28/2015    Past Surgical History:  Procedure Laterality Date  . MASTECTOMY Left   . TOTAL HIP ARTHROPLASTY Left 02/02/2020   Procedure: LEFT TOTAL HIP ARTHROPLASTY ANTERIOR APPROACH;  Surgeon: Kathryne Hitch, MD;  Location: [facility];  Service: Orthopedics;  Laterality: Left;    Current Medications: Current Meds  Medication Sig  . acetaminophen (TYLENOL) 500 MG tablet Take 1,000 mg by mouth in the morning and at bedtime.  . calcium-vitamin D (OSCAL WITH D) 500-200 MG-UNIT tablet Take 1 tablet by mouth 2 (two) times daily.   . dorzolamide-timolol (COSOPT) 22.3-6.8 MG/ML ophthalmic solution Place 1 drop into both eyes 2 (two) times daily.  .  famotidine (PEPCID) 40 MG tablet Take 40 mg by mouth daily as needed (heartburn/indigestion.).   Marland Kitchen loratadine (CLARITIN) 10 MG tablet Take 10 mg by mouth daily as needed for allergies.  Marland Kitchen LUMIGAN 0.01 % SOLN Place 1 drop into both eyes at bedtime.   . metoprolol succinate (TOPROL-XL) 50 MG 24 hr tablet Take 50 mg by mouth daily with supper.   . Multiple Vitamin (MULTIVITAMIN WITH MINERALS) TABS tablet Take 1 tablet by mouth daily at 12 noon.  . Omega-3 Fatty Acids (FISH OIL) 1000 MG CAPS Take 1,000 mg by mouth daily at 12 noon.  . pravastatin (PRAVACHOL) 20 MG tablet Take 1 tablet (20 mg total) by mouth every other day. (Patient taking differently: Take 20 mg by mouth every other day. At night.)  . Probiotic Product (PROBIOTIC DAILY PO) Take 1 capsule by mouth daily.   . raloxifene (EVISTA) 60 MG tablet Take 60 mg by mouth every evening.   . sertraline (ZOLOFT) 50 MG tablet Take 50 mg by mouth daily.   Marland Kitchen spironolactone-hydrochlorothiazide (ALDACTAZIDE) 25-25 MG tablet TAKE 1/2 TABLET BY MOUTH DAILY. (Patient taking differently: Take 0.5 tablets by mouth daily. )  . traZODone (DESYREL) 50 MG tablet Take 25 mg by mouth as directed.     Allergies:   Patient has no known allergies.   Social History   Socioeconomic History  . Marital status: Widowed    Spouse name: Not on file  . Number of children: Not on file  . Years of education: Not on file  . Highest education level: Not on file  Occupational History  . Not on file  Tobacco Use  . Smoking status: Never Smoker  . Smokeless tobacco: Never Used  Vaping Use  . Vaping Use: Never used  Substance and Sexual Activity  . Alcohol use: No  . Drug use: No  . Sexual activity: Not on file  Other Topics Concern  . Not on file  Social History Narrative  . Not on file   Social Determinants of Health   Financial Resource Strain:   . Difficulty of Paying Living Expenses: Not on file  Food Insecurity:   . Worried About Programme researcher, broadcasting/film/video  in the Last Year: Not on file  . Ran Out of Food in the Last Year: Not on file  Transportation Needs:   . Lack of Transportation (Medical): Not on file  . Lack of Transportation (Non-Medical): Not on file  Physical Activity:   . Days of Exercise per Week: Not on file  . Minutes of Exercise per Session: Not on file  Stress:   . Feeling of Stress : Not on file  Social Connections:   . Frequency of Communication with Friends and Family: Not on file  . Frequency of Social Gatherings with Friends and Family: Not on file  . Attends Religious Services: Not on file  . Active Member of Clubs or Organizations: Not on file  . Attends Banker Meetings: Not on file  .  Marital Status: Not on file     Family History: The patient's family history includes Diabetes in her mother. ROS:   Please see the history of present illness.    All other systems reviewed and are negative.  EKGs/Labs/Other Studies Reviewed:    The following studies were reviewed today:  EKG:  EKG ordered today and personally reviewed.  The ekg ordered today demonstrates sinus rhythm there is one APC present poor R wave progression otherwise normal  Recent Labs: 02/03/2020: BUN 18; Creatinine, Ser 0.59; Potassium 3.5; Sodium 127 02/05/2020: Hemoglobin 7.7; Platelets 288  Recent Lipid Panel No results found for: CHOL, TRIG, HDL, CHOLHDL, VLDL, LDLCALC, LDLDIRECT  Physical Exam:    VS:  BP (!) 168/70   Pulse 80   Ht 5\' 5"  (1.651 m)   Wt 114 lb 9.6 oz (52 kg)   SpO2 98%   BMI 19.07 kg/m     Wt Readings from Last 3 Encounters:  04/23/20 114 lb 9.6 oz (52 kg)  02/02/20 115 lb 15.4 oz (52.6 kg)  01/26/20 116 lb (52.6 kg)     GEN: She looks frail well nourished, well developed in no acute distress HEENT: Normal NECK: No JVD; No carotid bruits LYMPHATICS: No lymphadenopathy CARDIAC: RRR, no murmurs, rubs, gallops RESPIRATORY:  Clear to auscultation without rales, wheezing or rhonchi  ABDOMEN: Soft,  non-tender, non-distended MUSCULOSKELETAL:  No edema; No deformity  SKIN: Warm and dry NEUROLOGIC:  Alert and oriented x 3 PSYCHIATRIC:  Normal affect    Signed, 01/28/20, MD  04/23/2020 4:47 PM    [facility]

## 2020-04-23 NOTE — Patient Instructions (Signed)

## 2020-04-24 NOTE — Addendum Note (Signed)
Addended by: Delorse Limber I on: 04/24/2020 02:22 PM   Modules accepted: Orders

## 2020-06-13 DIAGNOSIS — K591 Functional diarrhea: Secondary | ICD-10-CM | POA: Insufficient documentation

## 2020-06-13 HISTORY — DX: Functional diarrhea: K59.1

## 2020-06-27 ENCOUNTER — Encounter: Payer: Self-pay | Admitting: Physician Assistant

## 2020-06-27 ENCOUNTER — Ambulatory Visit (INDEPENDENT_AMBULATORY_CARE_PROVIDER_SITE_OTHER): Payer: Medicare Other | Admitting: Physician Assistant

## 2020-06-27 ENCOUNTER — Ambulatory Visit (INDEPENDENT_AMBULATORY_CARE_PROVIDER_SITE_OTHER): Payer: Medicare Other

## 2020-06-27 DIAGNOSIS — Z96642 Presence of left artificial hip joint: Secondary | ICD-10-CM

## 2020-06-27 NOTE — Progress Notes (Signed)
Office Visit Note   Patient: Susan Spencer           Date of Birth: Aug 07, 1930           MRN: [id number] Visit Date: 06/27/2020              Requested by: Gordan Payment., MD [address] [place],  [place] [address] PCP: Gordan Payment., MD   Assessment & Plan: Visit Diagnoses:  1. Status post total replacement of left hip     Plan: We will see her back at 1 year postop sooner if there is any questions or concerns.  Questions were encouraged and answered by Dr. Magnus Ivan and myself today.  Follow-Up Instructions: Return in about 4 weeks (around 07/25/2020).   Orders:  Orders Placed This Encounter  Procedures  . XR HIP UNILAT W OR W/O PELVIS 2-3 VIEWS LEFT   No orders of the defined types were placed in this encounter.     Procedures: No procedures performed   Clinical Data: No additional findings.   Subjective: Chief Complaint  Patient presents with  . Left Hip - Follow-up    HPI  Susan Spencer is a pleasant 86 year old female were seen back status post left total hip arthroplasty 02/02/2020.  She states she is overall doing well.  She is ambulating with a cane she has no pain in her hip whatsoever.  She has no concerns in regards to the hip. Review of Systems See HPI otherwise negative or noncontributory  Objective: Vital Signs: There were no vitals taken for this visit.  Physical Exam General: Well-developed well-nourished female no acute distress Psych: Alert and oriented x3 Ortho Exam Left hip excellent range of motion without pain.  Left calf supple nontender.  Dorsiflexion plantarflexion left ankle intact. Specialty Comments:  No specialty comments available.  Imaging: XR HIP UNILAT W OR W/O PELVIS 2-3 VIEWS LEFT  Result Date: 06/27/2020 AP pelvis lateral view of the left hip: Shows bilateral hips to be well located.  Left total hip arthroplasty components are well-seated no acute fractures or bony abnormalities.    PMFS History: Patient Active  Problem List   Diagnosis Date Noted  . Heart murmur   . Dyspnea   . Status post total replacement of left hip 02/02/2020  . Unilateral primary osteoarthritis, left hip 12/27/2019  . Carotid occlusion, bilateral 07/12/2017  . History of breast cancer 05/05/2016  . Malaise and fatigue 05/05/2016  . PVD (peripheral vascular disease) (HCC) 10/29/2015  . Anxiety disorder 10/28/2015  . Degeneration of lumbar intervertebral disc 10/28/2015  . Depression, major, recurrent (HCC) 10/28/2015  . GERD (gastroesophageal reflux disease) 10/28/2015  . High risk medication use 10/28/2015  . Mixed hyperlipidemia 10/28/2015  . IBS (irritable bowel syndrome) 10/28/2015  . Mitral valve disease 10/28/2015  . Occlusion and stenosis of bilateral carotid arteries 10/28/2015  . Osteopenia 10/28/2015  . Prediabetes 10/28/2015  . Primary osteoarthritis involving multiple joints 10/28/2015  . Spondylosis 10/28/2015  . Hypertensive heart disease with heart failure (HCC) 06/18/2015  . Essential hypertension 06/18/2015   Past Medical History:  Diagnosis Date  . Anxiety disorder 10/28/2015  . Carotid occlusion, bilateral 07/12/2017   left distal ICA is tortuous, increased velocity is seen    . Degeneration of lumbar intervertebral disc 10/28/2015  . Depression, major, recurrent (HCC) 10/28/2015  . Dyspnea    anxiety  . Essential hypertension 06/18/2015  . GERD (gastroesophageal reflux disease) 10/28/2015  . Heart murmur   . High  risk medication use 10/28/2015  . History of breast cancer 05/05/2016  . Hypertensive heart disease with heart failure (HCC) 06/18/2015  . IBS (irritable bowel syndrome) 10/28/2015  . Malaise and fatigue 05/05/2016  . Mitral valve disease 10/28/2015   Overview:  Mild mitral regurg.  . Mixed hyperlipidemia 10/28/2015  . Occlusion and stenosis of bilateral carotid arteries 10/28/2015  . Osteopenia 10/28/2015  . Prediabetes 10/28/2015  . Primary osteoarthritis involving multiple joints  10/28/2015  . PVD (peripheral vascular disease) (HCC) 10/29/2015   Overview:  Carotid.  50-69 Lreft 03/2014. [place]  . Spondylosis 10/28/2015    Family History  Problem Relation Age of Onset  . Diabetes Mother     Past Surgical History:  Procedure Laterality Date  . MASTECTOMY Left   . TOTAL HIP ARTHROPLASTY Left 02/02/2020   Procedure: LEFT TOTAL HIP ARTHROPLASTY ANTERIOR APPROACH;  Surgeon: Kathryne Hitch, MD;  Location: [facility];  Service: Orthopedics;  Laterality: Left;   Social History   Occupational History  . Not on file  Tobacco Use  . Smoking status: Never Smoker  . Smokeless tobacco: Never Used  Vaping Use  . Vaping Use: Never used  Substance and Sexual Activity  . Alcohol use: No  . Drug use: No  . Sexual activity: Not on file

## 2021-01-20 DIAGNOSIS — R42 Dizziness and giddiness: Secondary | ICD-10-CM

## 2021-01-20 HISTORY — DX: Dizziness and giddiness: R42

## 2021-01-23 ENCOUNTER — Ambulatory Visit: Payer: Self-pay

## 2021-01-23 ENCOUNTER — Ambulatory Visit (INDEPENDENT_AMBULATORY_CARE_PROVIDER_SITE_OTHER): Payer: Medicare Other | Admitting: Physician Assistant

## 2021-01-23 ENCOUNTER — Encounter: Payer: Self-pay | Admitting: Physician Assistant

## 2021-01-23 DIAGNOSIS — Z96642 Presence of left artificial hip joint: Secondary | ICD-10-CM | POA: Diagnosis not present

## 2021-01-23 NOTE — Progress Notes (Signed)
HPI: Susan Spencer returns today status post left total hip arthroplasty 02/02/2020.  She states that her hip overall is doing well.  She has no complaints in regards to the hip.  She does note that she has periodic back pain but no numbness tingling down either leg.  She does use a cane to ambulate.  Review of systems see HPI otherwise negative  Physical exam: General: Well-developed well-nourished female no acute distress.  Mood and affect appropriate Left hip excellent range of motion without pain.  Dorsiflexion plantarflexion left ankle intact.  Radiographs: AP pelvis lateral view left hip: Bilateral hips well located.  Status post left total hip arthroplasty with well-seated components.  No acute fractures bony abnormalities.  Impression: Status post left total hip arthroplasty 02/02/2020  Plan: She will follow-up with [place] as needed.  Questions encouraged and answered at length.

## 2021-03-24 IMAGING — RF DG HIP (WITH PELVIS) OPERATIVE*L*
1 series · 5 of 5 positions shown · non-contrast
Comparison: December 27, 2019.

CLINICAL DATA: Left hip replacement.

EXAM:
OPERATIVE left HIP (WITH PELVIS IF PERFORMED) 5 VIEWS
TECHNIQUE: Fluoroscopic spot image(s) were submitted for interpretation
post-operatively.
FLUOROSCOPY TIME:  18 seconds.

[Series 1: unknown protocol · 0.20mm/px · 5 of 5 slices shown]
[im 1/5]
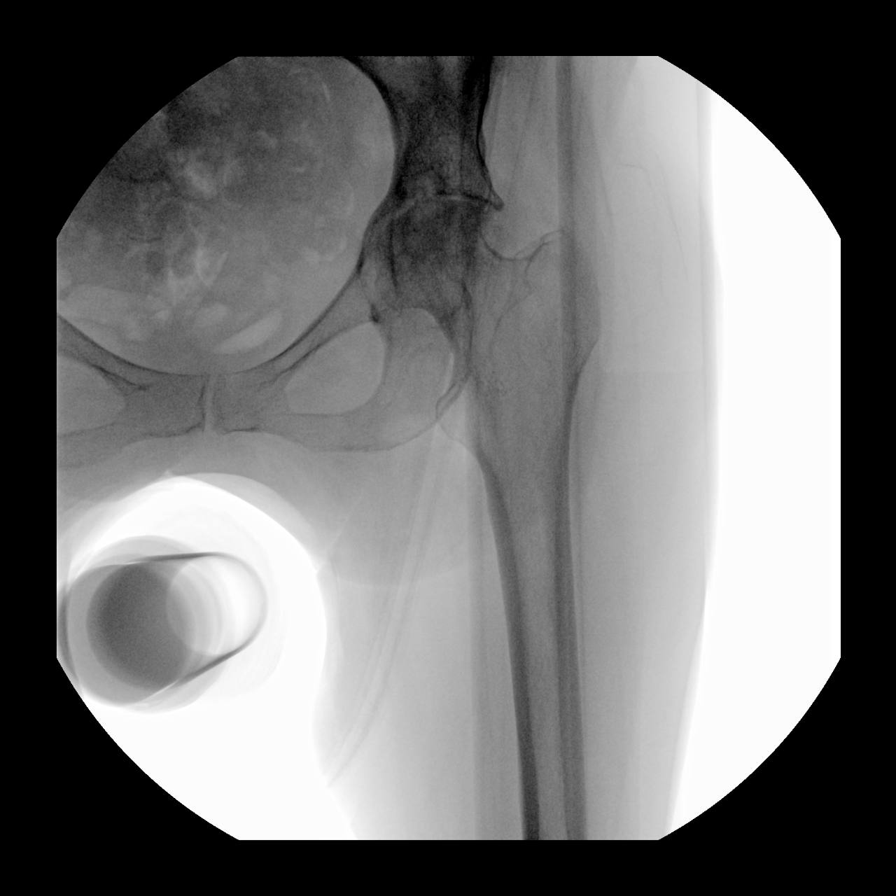
[im 2/5]
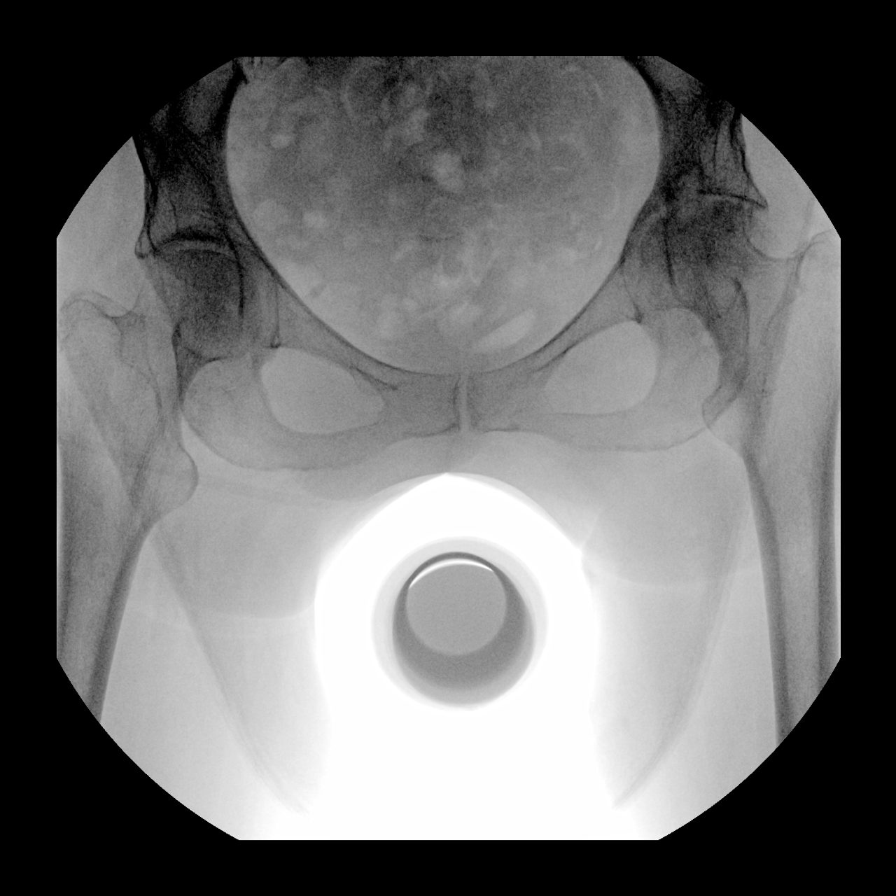
[im 3/5]
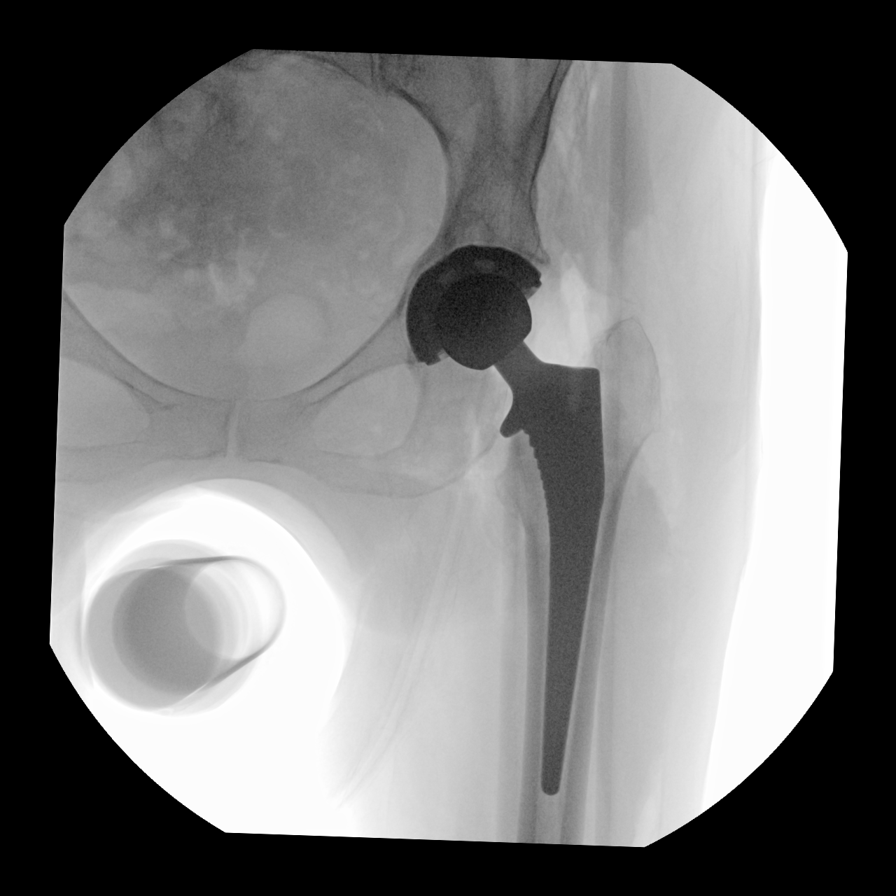
[im 4/5]
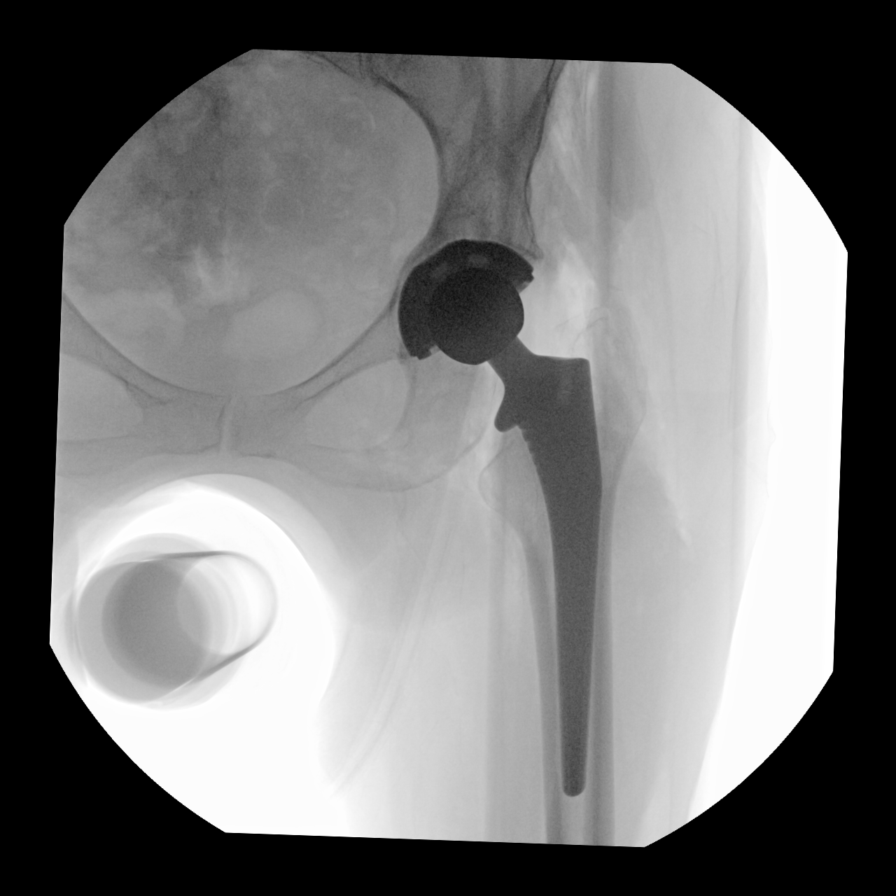
[im 5/5]
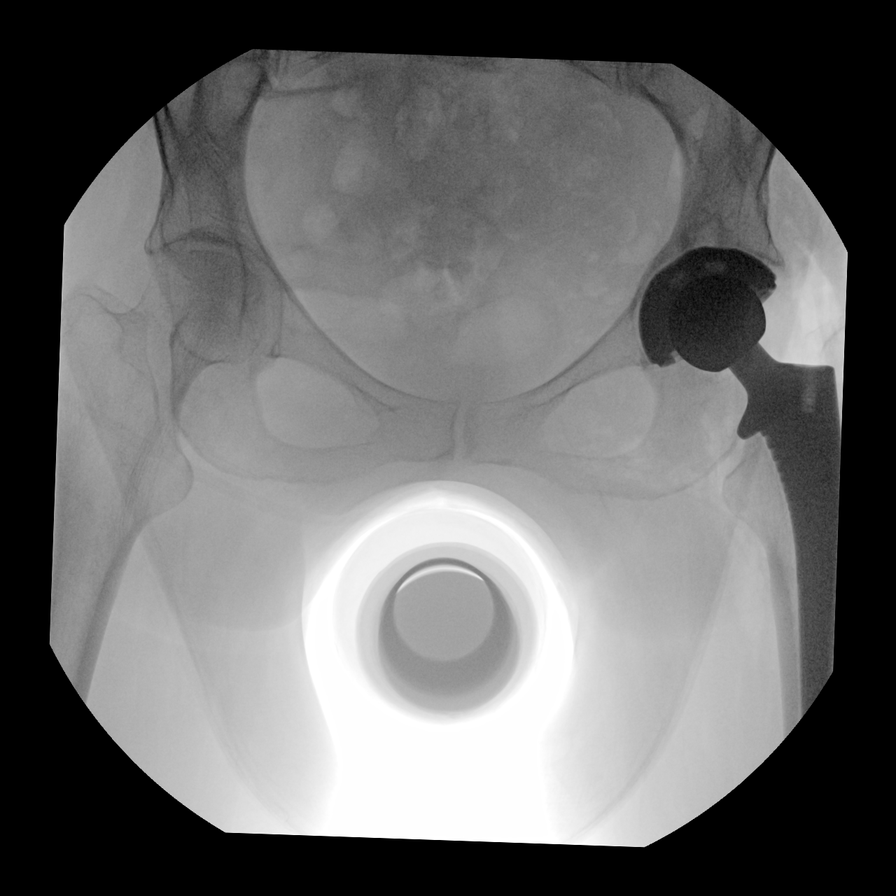

[5 of 5 positions shown; findings below may reference images not displayed]

FINDINGS: Five intraoperative fluoroscopic images were obtained of the left
hip. The left femoral and acetabular components appear to be well
situated. Expected postoperative changes are noted in the
surrounding soft tissues.
IMPRESSION: Status post left total hip arthroplasty.

## 2021-03-24 IMAGING — DX DG PORTABLE PELVIS
1 series · 1 of 1 positions shown · non-contrast
Comparison: 12/27/2019

CLINICAL DATA: Status post left total hip arthroplasty.

EXAM:
PORTABLE PELVIS 1-2 VIEWS

[pelvis ap]
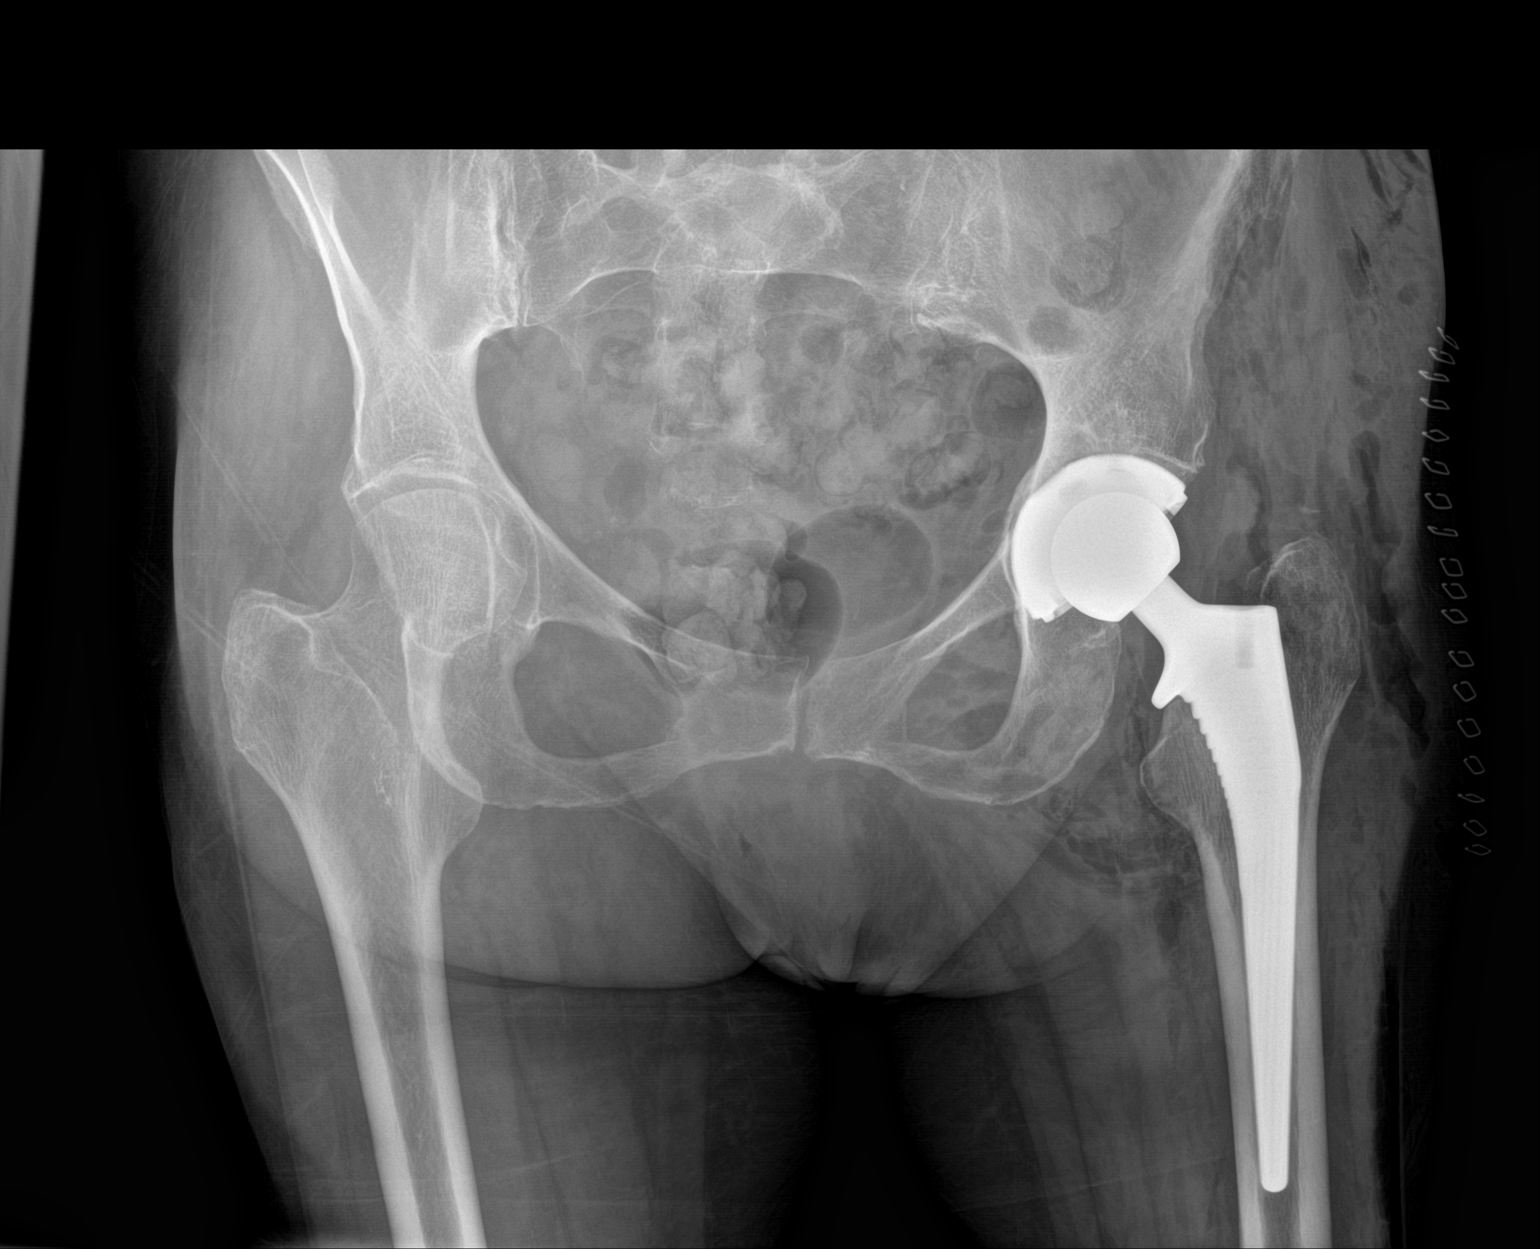

[1 of 1 positions shown; findings below may reference images not displayed]

FINDINGS: Interval left total hip arthroplasty. Hardware components are in
anatomic alignment. No periprosthetic fracture or dislocation. Gas
is identified within the soft tissues overlying the left hip.
IMPRESSION: Status post left total hip arthroplasty.

## 2021-06-27 ENCOUNTER — Ambulatory Visit (INDEPENDENT_AMBULATORY_CARE_PROVIDER_SITE_OTHER): Payer: Medicare Other | Admitting: Cardiology

## 2021-06-27 ENCOUNTER — Other Ambulatory Visit: Payer: Self-pay

## 2021-06-27 ENCOUNTER — Encounter: Payer: Self-pay | Admitting: Cardiology

## 2021-06-27 VITALS — BP 150/60 | HR 69 | Ht 65.0 in | Wt 115.0 lb

## 2021-06-27 DIAGNOSIS — I110 Hypertensive heart disease with heart failure: Secondary | ICD-10-CM | POA: Diagnosis not present

## 2021-06-27 DIAGNOSIS — I739 Peripheral vascular disease, unspecified: Secondary | ICD-10-CM | POA: Diagnosis not present

## 2021-06-27 DIAGNOSIS — E782 Mixed hyperlipidemia: Secondary | ICD-10-CM

## 2021-06-27 DIAGNOSIS — I6523 Occlusion and stenosis of bilateral carotid arteries: Secondary | ICD-10-CM

## 2021-06-27 NOTE — Patient Instructions (Signed)

## 2021-06-27 NOTE — Progress Notes (Signed)
Cardiology Office Note:    Date:  06/27/2021   ID:  Susan Spencer, DOB 1929/12/13, MRN [id number]  PCP:  Gordan Payment., MD  Cardiologist:  Norman Herrlich, MD    Referring MD: Gordan Payment., MD    ASSESSMENT:    1. Hypertensive heart disease with heart failure (HCC)   2. Mixed hyperlipidemia   3. Carotid occlusion, bilateral   4. PVD (peripheral vascular disease) (HCC)    PLAN:    In order of problems listed above:  Stable BP at target I would not reduce her systolic kidney and lower the diastolic of 60 and continue current treatment including her thiazide diuretic spironolactone combination and beta-blocker. Stable continue low intensity statin Physical exam has diffuse bruits I suspect she is going to have bilateral carotid disease as well as great vessel disease and I did ask her to have a duplex performed to assess both her subclavian's vertebrals and carotid arteries continue aspirin statin and antihypertensives   Next appointment: 1 year   Medication Adjustments/Labs and Tests Ordered: Current medicines are reviewed at length with the patient today.  Concerns regarding medicines are outlined above.  Orders Placed This Encounter  Procedures   EKG 12-Lead   No orders of the defined types were placed in this encounter.   Chief Complaint  Patient presents with   Follow-up   Congestive Heart Failure    History of Present Illness:    Susan Spencer is a 87 y.o. female with a hx of hypertensive heart disease with heart failure last seen 04/23/2020.  Compliance with diet, lifestyle and medications: Yes  She has had no edema but tells me at times she is breathless if she is anxious when she hurries.  No orthopnea chest pain palpitation or syncope. She has had no TIA.  Recent labs [facility] 04/07/2021: Creatinine 0.63 GFR 85 cc sodium 134 and potassium 4.7 normal liver function test TSH 1.56 hemoglobin 14.1 01/20/2021 cholesterol 143 LDL 85 triglycerides 47  HDL 62 Past Medical History:  Diagnosis Date   Anxiety disorder 10/28/2015   Carotid occlusion, bilateral 07/12/2017   left distal ICA is tortuous, increased velocity is seen     Degeneration of lumbar intervertebral disc 10/28/2015   Depression, major, recurrent (HCC) 10/28/2015   Dyspnea    anxiety   Essential hypertension 06/18/2015   GERD (gastroesophageal reflux disease) 10/28/2015   Heart murmur    High risk medication use 10/28/2015   History of breast cancer 05/05/2016   Hypertensive heart disease with heart failure (HCC) 06/18/2015   IBS (irritable bowel syndrome) 10/28/2015   Malaise and fatigue 05/05/2016   Mitral valve disease 10/28/2015   Overview:  Mild mitral regurg.   Mixed hyperlipidemia 10/28/2015   Occlusion and stenosis of bilateral carotid arteries 10/28/2015   Osteopenia 10/28/2015   Prediabetes 10/28/2015   Primary osteoarthritis involving multiple joints 10/28/2015   PVD (peripheral vascular disease) (HCC) 10/29/2015   Overview:  Carotid.  50-69 Lreft 03/2014. Susan Spencer   Spondylosis 10/28/2015    Past Surgical History:  Procedure Laterality Date   MASTECTOMY Left    TOTAL HIP ARTHROPLASTY Left 02/02/2020   Procedure: LEFT TOTAL HIP ARTHROPLASTY ANTERIOR APPROACH;  Surgeon: Kathryne Hitch, MD;  Location: [facility];  Service: Orthopedics;  Laterality: Left;    Current Medications: Current Meds  Medication Sig   aspirin 81 MG EC tablet Take 1 tablet by mouth daily.   Calcium Carbonate (CALCIUM 500 PO) Take 1 tablet by  mouth in the morning and at bedtime.   Cholecalciferol (VITAMIN D3) 125 MCG (5000 UT) TABS Take 1 tablet by mouth daily.   dorzolamide-timolol (COSOPT) 22.3-6.8 MG/ML ophthalmic solution Place 1 drop into both eyes 2 (two) times daily.   famotidine (PEPCID) 40 MG tablet Take 40 mg by mouth daily as needed (heartburn/indigestion.).    fluticasone (FLONASE) 50 MCG/ACT nasal spray Place 1 spray into both nostrils 2 (two) times daily.   LUMIGAN 0.01 % SOLN  Place 1 drop into both eyes at bedtime.    metoprolol succinate (TOPROL-XL) 50 MG 24 hr tablet Take 50 mg by mouth daily with supper.    Multiple Vitamin (MULTIVITAMIN WITH MINERALS) TABS tablet Take 1 tablet by mouth daily at 12 noon.   Omega-3 Fatty Acids (FISH OIL) 1000 MG CAPS Take 1,000 mg by mouth daily at 12 noon.   pravastatin (PRAVACHOL) 20 MG tablet Take 1 tablet (20 mg total) by mouth every other day. (Patient taking differently: Take 20 mg by mouth every other day. At night.)   Probiotic Product (PROBIOTIC DAILY PO) Take 1 capsule by mouth daily.    raloxifene (EVISTA) 60 MG tablet Take 60 mg by mouth every evening.    sertraline (ZOLOFT) 50 MG tablet Take 50 mg by mouth daily.    spironolactone-hydrochlorothiazide (ALDACTAZIDE) 25-25 MG tablet TAKE 1/2 TABLET BY MOUTH DAILY. (Patient taking differently: Take 0.5 tablets by mouth daily.)   traZODone (DESYREL) 50 MG tablet Take 25 mg by mouth at bedtime as needed for sleep.     Allergies:   Patient has no known allergies.   Social History   Socioeconomic History   Marital status: Widowed    Spouse name: Not on file   Number of children: Not on file   Years of education: Not on file   Highest education level: Not on file  Occupational History   Not on file  Tobacco Use   Smoking status: Never   Smokeless tobacco: Never  Vaping Use   Vaping Use: Never used  Substance and Sexual Activity   Alcohol use: No   Drug use: No   Sexual activity: Not on file  Other Topics Concern   Not on file  Social History Narrative   Not on file   Social Determinants of Health   Financial Resource Strain: Not on file  Food Insecurity: Not on file  Transportation Needs: Not on file  Physical Activity: Not on file  Stress: Not on file  Social Connections: Not on file     Family History: The patient's family history includes Diabetes in her mother. ROS:   Please see the history of present illness.    All other systems reviewed and  are negative.  EKGs/Labs/Other Studies Reviewed:    The following studies were reviewed today:  EKG:  EKG ordered today and personally reviewed.  The ekg ordered today demonstrates sinus rhythm 1 PVC otherwise normal EKG  Recent Labs: See history  Physical Exam:    VS:  BP (!) 150/60 (BP Location: Right Arm, Patient Position: Sitting, Cuff Size: Normal)   Pulse 69   Ht 5\' 5"  (1.651 m)   Wt 115 lb (52.2 kg)   SpO2 98%   BMI 19.14 kg/m     Wt Readings from Last 3 Encounters:  06/27/21 115 lb (52.2 kg)  04/23/20 114 lb 9.6 oz (52 kg)  02/02/20 115 lb 15.4 oz (52.6 kg)     GEN: Quite frail well nourished, well developed in  no acute distress HEENT: Normal NECK: No JVD; she has mild bilateral carotid bruits as well as bruits into the brachial artery bilaterally LYMPHATICS: No lymphadenopathy CARDIAC: 1/6 systolic ejection murmur RRR, no murmurs, rubs, gallops RESPIRATORY:  Clear to auscultation without rales, wheezing or rhonchi  ABDOMEN: Soft, non-tender, non-distended MUSCULOSKELETAL:  No edema; No deformity  SKIN: Warm and dry NEUROLOGIC:  Alert and oriented x 3 PSYCHIATRIC:  Normal affect    Signed, Norman Herrlich, MD  06/27/2021 3:37 PM    [facility]

## 2022-02-24 DIAGNOSIS — Z9181 History of falling: Secondary | ICD-10-CM | POA: Insufficient documentation

## 2022-02-24 DIAGNOSIS — R269 Unspecified abnormalities of gait and mobility: Secondary | ICD-10-CM

## 2022-02-24 HISTORY — DX: Unspecified abnormalities of gait and mobility: R26.9

## 2022-02-24 HISTORY — DX: History of falling: Z91.81

## 2022-08-21 ENCOUNTER — Ambulatory Visit: Payer: Medicare Other | Attending: Cardiology | Admitting: Cardiology

## 2022-08-21 ENCOUNTER — Encounter: Payer: Self-pay | Admitting: Cardiology

## 2022-08-21 VITALS — BP 154/50 | HR 66 | Ht 64.5 in | Wt 121.8 lb

## 2022-08-21 DIAGNOSIS — I110 Hypertensive heart disease with heart failure: Secondary | ICD-10-CM | POA: Insufficient documentation

## 2022-08-21 DIAGNOSIS — E782 Mixed hyperlipidemia: Secondary | ICD-10-CM | POA: Diagnosis not present

## 2022-08-21 NOTE — Progress Notes (Signed)
Cardiology Office Note:    Date:  08/21/2022   ID:  Susan Spencer, DOB 06-23-1930, MRN [id number]  PCP:  Raina Mina., MD  Cardiologist:  Shirlee More, MD    Referring MD: Raina Mina., MD    ASSESSMENT:    1. Hypertensive heart disease with heart failure ([place])   2. Mixed hyperlipidemia    PLAN:    In order of problems listed above:  Runner continues to do well for age.  Blood pressure in office I think is at target I would want to see her diastolic less than 50 and I asked her to continue to check at home bring readings to the office with her.  Continue treatment including combination thiazide diuretic spironolactone beta-blocker and currently not on a loop diuretic LDL at target continue her statin   Next appointment: 1 year   Medication Adjustments/Labs and Tests Ordered: Current medicines are reviewed at length with the patient today.  Concerns regarding medicines are outlined above.  No orders of the defined types were placed in this encounter.  No orders of the defined types were placed in this encounter.   Chief Complaint  Patient presents with   Follow-up   Congestive Heart Failure    History of Present Illness:    Susan Spencer is a 89 y.o. female with a hx of hypertensive heart disease with heart failure hyperlipidemia last seen 06/27/2021.  Compliance with diet, lifestyle and medications: Yes  She checks her blood pressure tends to run on the range of 140/50 Hospitalizations or illnesses in the interim  she has not had shortness of breath edema chest pain palpitation or syncope She tolerates her statin without muscle pain or weakness Her LDL is at target  Recent labs 02/24/2022 [place] PCP: Sodium 129 potassium 4.6 creatinine 0.71 GFR 81 cc/min Cholesterol 152 LDL 86 triglycerides 45 HDL 61 Hemoglobin 14.5 platelets 398,000 Past Medical History:  Diagnosis Date   Anxiety disorder 10/28/2015   Carotid occlusion, bilateral  07/12/2017   left distal ICA is tortuous, increased velocity is seen     Degeneration of lumbar intervertebral disc 10/28/2015   Depression, major, recurrent ([place]) 10/28/2015   Dyspnea    anxiety   Essential hypertension 06/18/2015   GERD (gastroesophageal reflux disease) 10/28/2015   Heart murmur    High risk medication use 10/28/2015   History of breast cancer 05/05/2016   Hypertensive heart disease with heart failure ([place]) 06/18/2015   IBS (irritable bowel syndrome) 10/28/2015   Malaise and fatigue 05/05/2016   Mitral valve disease 10/28/2015   Overview:  Mild mitral regurg.   Mixed hyperlipidemia 10/28/2015   Occlusion and stenosis of bilateral carotid arteries 10/28/2015   Osteopenia 10/28/2015   Prediabetes 10/28/2015   Primary osteoarthritis involving multiple joints 10/28/2015   PVD (peripheral vascular disease) ([place]) 10/29/2015   Overview:  Carotid.  50-69 Lreft 03/2014. Kennady Zimmerle   Spondylosis 10/28/2015    Past Surgical History:  Procedure Laterality Date   MASTECTOMY Left    TOTAL HIP ARTHROPLASTY Left 02/02/2020   Procedure: LEFT TOTAL HIP ARTHROPLASTY ANTERIOR APPROACH;  Surgeon: Mcarthur Rossetti, MD;  Location: [facility];  Service: Orthopedics;  Laterality: Left;    Current Medications: Current Meds  Medication Sig   aspirin 81 MG EC tablet Take 1 tablet by mouth daily.   Calcium Carbonate (CALCIUM 500 PO) Take 1 tablet by mouth in the morning and at bedtime.   Cholecalciferol (VITAMIN D3) 125 MCG (5000 UT)  TABS Take 1 tablet by mouth daily.   dorzolamide-timolol (COSOPT) 22.3-6.8 MG/ML ophthalmic solution Place 1 drop into both eyes 2 (two) times daily.   famotidine (PEPCID) 40 MG tablet Take 40 mg by mouth daily as needed (heartburn/indigestion.).    LUMIGAN 0.01 % SOLN Place 1 drop into both eyes at bedtime.    metoprolol succinate (TOPROL-XL) 50 MG 24 hr tablet Take 50 mg by mouth daily with supper.    Multiple Vitamin (MULTIVITAMIN WITH MINERALS) TABS tablet Take 1 tablet  by mouth daily at 12 noon.   Omega-3 Fatty Acids (FISH OIL) 1000 MG CAPS Take 1,000 mg by mouth daily at 12 noon.   pravastatin (PRAVACHOL) 20 MG tablet Take 1 tablet (20 mg total) by mouth every other day. (Patient taking differently: Take 20 mg by mouth every other day. At night.)   Probiotic Product (PROBIOTIC DAILY PO) Take 1 capsule by mouth daily.    raloxifene (EVISTA) 60 MG tablet Take 60 mg by mouth every evening.    sertraline (ZOLOFT) 50 MG tablet Take 50 mg by mouth daily.    spironolactone-hydrochlorothiazide (ALDACTAZIDE) 25-25 MG tablet TAKE 1/2 TABLET BY MOUTH DAILY. (Patient taking differently: Take 0.5 tablets by mouth daily.)   traZODone (DESYREL) 50 MG tablet Take 25 mg by mouth at bedtime as needed for sleep.     Allergies:   Patient has no known allergies.   Social History   Socioeconomic History   Marital status: Widowed    Spouse name: Not on file   Number of children: Not on file   Years of education: Not on file   Highest education level: Not on file  Occupational History   Not on file  Tobacco Use   Smoking status: Never   Smokeless tobacco: Never  Vaping Use   Vaping Use: Never used  Substance and Sexual Activity   Alcohol use: No   Drug use: No   Sexual activity: Not on file  Other Topics Concern   Not on file  Social History Narrative   Not on file   Social Determinants of Health   Financial Resource Strain: Not on file  Food Insecurity: Not on file  Transportation Needs: Not on file  Physical Activity: Not on file  Stress: Not on file  Social Connections: Not on file     Family History: The patient's family history includes Diabetes in her mother. ROS:   Please see the history of present illness.    All other systems reviewed and are negative.  EKGs/Labs/Other Studies Reviewed:    The following studies were reviewed today:  EKG:  EKG ordered today and personally reviewed.  The ekg ordered today demonstrates sinus rhythm poor R  wave progression otherwise normal EKG    Physical Exam:    VS:  BP (!) 182/72 (BP Location: Right Arm, Patient Position: Sitting)   Pulse 66   Ht 5' 4.5" (1.638 m)   Wt 121 lb 12.8 oz (55.2 kg)   SpO2 99%   BMI 20.58 kg/m     Wt Readings from Last 3 Encounters:  08/21/22 121 lb 12.8 oz (55.2 kg)  06/27/21 115 lb (52.2 kg)  04/23/20 114 lb 9.6 oz (52 kg)     GEN: She appears younger than her age well nourished, well developed in no acute distress HEENT: Normal NECK: No JVD; No carotid bruits LYMPHATICS: No lymphadenopathy CARDIAC: RRR, no murmurs, rubs, gallops RESPIRATORY:  Clear to auscultation without rales, wheezing or rhonchi  ABDOMEN:  Soft, non-tender, non-distended MUSCULOSKELETAL:  No edema; No deformity  SKIN: Warm and dry NEUROLOGIC:  Alert and oriented x 3 PSYCHIATRIC:  Normal affect    Signed, Shirlee More, MD  08/21/2022 1:34 PM    [place]

## 2022-08-21 NOTE — Patient Instructions (Addendum)
Medication Instructions:  Your physician recommends that you continue on your current medications as directed. Please refer to the Current Medication list given to you today.  *If you need a refill on your cardiac medications before your next appointment, please call your pharmacy*   Lab Work: None If you have labs (blood work) drawn today and your tests are completely normal, you will receive your results only by: [place] (if you have MyChart) OR A paper copy in the mail If you have any lab test that is abnormal or we need to change your treatment, we will call you to review the results.   Testing/Procedures: None   Follow-Up: At [facility], you and your health needs are our priority.  As part of our continuing mission to provide you with exceptional heart care, we have created designated Provider Care Teams.  These Care Teams include your primary Cardiologist (physician) and Advanced Practice Providers (APPs -  Physician Assistants and Nurse Practitioners) who all work together to provide you with the care you need, when you need it.  We recommend signing up for the patient portal called "MyChart".  Sign up information is provided on this After Visit Summary.  MyChart is used to connect with patients for Virtual Visits (Telemedicine).  Patients are able to view lab/test results, encounter notes, upcoming appointments, etc.  Non-urgent messages can be sent to your provider as well.   To learn more about what you can do with MyChart, go to [email or web address].    Your next appointment:   1 year(s)  Provider:   Shirlee More, MD    Other Instructions None     [facility]  Tips to measure your blood pressure correctly  To determine whether you have hypertension, a medical professional will take a blood pressure reading. How you prepare for the test, the position of your arm, and other factors can change a blood pressure reading by 10% or more. That could be  enough to hide high blood pressure, start you on a drug you don't really need, or lead your doctor to incorrectly adjust your medications. National and international guidelines offer specific instructions for measuring blood pressure. If a doctor, nurse, or medical assistant isn't doing it right, don't hesitate to ask him or her to get with the guidelines. Here's what you can do to ensure a correct reading:  Don't drink a caffeinated beverage or smoke during the 30 minutes before the test.  Sit quietly for five minutes before the test begins.  During the measurement, sit in a chair with your feet on the floor and your arm supported so your elbow is at about heart level.  The inflatable part of the cuff should completely cover at least 80% of your upper arm, and the cuff should be placed on bare skin, not over a shirt.  Don't talk during the measurement.  Have your blood pressure measured twice, with a brief break in between. If the readings are different by 5 points or more, have it done a third time. There are times to break these rules. If you sometimes feel lightheaded when getting out of bed in the morning or when you stand after sitting, you should have your blood pressure checked while seated and then while standing to see if it falls from one position to the next. Because blood pressure varies throughout the day, your doctor will rarely diagnose hypertension on the basis of a single reading. Instead, he or she will want to confirm the  measurements on at least two occasions, usually within a few weeks of one another. The exception to this rule is if you have a blood pressure reading of 180/110 mm Hg or higher. A result this high usually calls for prompt treatment. It's also a good idea to have your blood pressure measured in both arms at least once, since the reading in one arm (usually the right) may be higher than that in the left. A 2014 study in The American Journal of Medicine of nearly 3,400  people found average arm- to-arm differences in systolic blood pressure of about 5 points. The higher number should be used to make treatment decisions. In 2017, new guidelines from the [place], the [employer] of Cardiology, and nine other health organizations lowered the diagnosis of high blood pressure to 130/80 mm Hg or higher for all adults. The guidelines also redefined the various blood pressure categories to now include normal, elevated, Stage 1 hypertension, Stage 2 hypertension, and hypertensive crisis (see "Blood pressure categories"). Blood pressure categories  Blood pressure category SYSTOLIC (upper number)  DIASTOLIC (lower number)  Normal Less than 120 mm Hg and Less than 80 mm Hg  Elevated 120-129 mm Hg and Less than 80 mm Hg  High blood pressure: Stage 1 hypertension 130-139 mm Hg or 80-89 mm Hg  High blood pressure: Stage 2 hypertension 140 mm Hg or higher or 90 mm Hg or higher  Hypertensive crisis (consult your doctor immediately) Higher than 180 mm Hg and/or Higher than 120 mm Hg  Source: American Heart Association and American Stroke Association. For more on getting your blood pressure under control, buy Controlling Your Blood Pressure, a Special Health Report from [facility].   Record your home BP and bring to your office visits

## 2022-11-03 DIAGNOSIS — H9192 Unspecified hearing loss, left ear: Secondary | ICD-10-CM

## 2022-11-03 HISTORY — DX: Unspecified hearing loss, left ear: H91.92

## 2023-04-20 DIAGNOSIS — Z1389 Encounter for screening for other disorder: Secondary | ICD-10-CM | POA: Insufficient documentation

## 2023-04-20 DIAGNOSIS — M25571 Pain in right ankle and joints of right foot: Secondary | ICD-10-CM | POA: Insufficient documentation

## 2023-04-20 DIAGNOSIS — M205X1 Other deformities of toe(s) (acquired), right foot: Secondary | ICD-10-CM

## 2023-04-20 HISTORY — DX: Other deformities of toe(s) (acquired), right foot: M20.5X1

## 2023-04-20 HISTORY — DX: Pain in right ankle and joints of right foot: M25.571

## 2023-05-04 DIAGNOSIS — M71371 Other bursal cyst, right ankle and foot: Secondary | ICD-10-CM

## 2023-05-04 HISTORY — DX: Other bursal cyst, right ankle and foot: M71.371

## 2023-07-15 DIAGNOSIS — M76811 Anterior tibial syndrome, right leg: Secondary | ICD-10-CM

## 2023-07-15 DIAGNOSIS — M79675 Pain in left toe(s): Secondary | ICD-10-CM | POA: Insufficient documentation

## 2023-07-15 DIAGNOSIS — M7989 Other specified soft tissue disorders: Secondary | ICD-10-CM

## 2023-07-15 HISTORY — DX: Anterior tibial syndrome, right leg: M76.811

## 2023-07-15 HISTORY — DX: Other specified soft tissue disorders: M79.89

## 2023-09-07 ENCOUNTER — Encounter: Payer: Self-pay | Admitting: Cardiology

## 2023-09-08 NOTE — Progress Notes (Unsigned)
Cardiology Office Note:    Date:  09/09/2023   ID:  Susan Spencer, DOB Feb 23, 1930, MRN [id number]  PCP:  Gordan Payment., MD  Cardiologist:  Norman Herrlich, MD    Referring MD: Gordan Payment., MD    ASSESSMENT:    1. Hypertensive heart disease with heart failure (HCC)   2. Mixed hyperlipidemia    PLAN:    In order of problems listed above:  I am not sure how well her hypertension is controlled I have a great deal of faith and ambulatory monitors she will check her blood pressure at home same time each day 5 to 10 minutes of rest seated legs uncrossed supportive and bring a list to my office her son will send it through MyChart.  At her age group would like to see her systolic blood pressure 1 40-1 50 and diastolic greater than 60. No indication of heart failure currently not on a diuretic She takes a low intensity statin pravastatin.   Next appointment: Plan to see her in 1 year.   Medication Adjustments/Labs and Tests Ordered: Current medicines are reviewed at length with the patient today.  Concerns regarding medicines are outlined above.  Orders Placed This Encounter  Procedures   EKG 12-Lead   No orders of the defined types were placed in this encounter.    History of Present Illness:    Susan Spencer is a 89 y.o. female with a hx of hypertensive heart disease with heart failure and hyperlipidemia last seen 08/21/2022.  Recent lab was [facility] 04/13/2023 hemoglobin 13.4 platelet count also normal at 427,000 CMP with a potassium 4.9 sodium 137 creatinine 0.67 GFR 83 cc/min Lipid profile 02/24/2022 cholesterol 52 LDL 86 non-HDL cholesterol  Compliance with diet, lifestyle and medications: Yes  Her primary problem recurrent episodes of severe vertigo she is being seen by ENT next week Medications were discontinued including her combination thiazide diuretic and spironolactone She occasionally checks her blood pressure at home and she runs in the 140s to 150s  systolic over 70 range which I think is ideal for her age She said it was difficult getting here to the office today her blood pressure is 180/76. As opposed to giving her additional medications but I have asked her to do is to check at home with a validated device daily 2 weeks and give me a list in the interim she will continue her ARB and her beta-blocker.  She may need to restart spironolactone She is not having edema shortness of breath chest pain or syncope She tells me that her life is more difficult and that she now struggles day today and attributes it to age Past Medical History:  Diagnosis Date   Anterior tibialis tendonitis of right leg 07/15/2023   Anxiety disorder 10/28/2015   At high risk for falls 02/24/2022   Carotid occlusion, bilateral 07/12/2017   left distal ICA is tortuous, increased velocity is seen     Degeneration of lumbar intervertebral disc 10/28/2015   Depression, major, recurrent (HCC) 10/28/2015   Dyspnea    anxiety   Essential hypertension 06/18/2015   Functional diarrhea 06/13/2020   Gait abnormality 02/24/2022   GERD (gastroesophageal reflux disease) 10/28/2015   Hearing loss of left ear 11/03/2022   Heart murmur    High risk medication use 10/28/2015   History of breast cancer 05/05/2016   Hypertensive heart disease with heart failure (HCC) 06/18/2015   IBS (irritable bowel syndrome) 10/28/2015   Malaise and fatigue  05/05/2016   Mitral valve disease 10/28/2015   Overview:  Mild mitral regurg.   Mixed hyperlipidemia 10/28/2015   Occlusion and stenosis of bilateral carotid arteries 10/28/2015   Osteopenia 10/28/2015   Other bursal cyst, right ankle and foot 05/04/2023   Overlapping toe, right 04/20/2023   Pain and swelling of toe, left 07/15/2023   Pain, joint, ankle, right 04/20/2023   Prediabetes 10/28/2015   Primary osteoarthritis involving multiple joints 10/28/2015   PVD (peripheral vascular disease) (HCC) 10/29/2015   Overview:  Carotid.   50-69 Lreft 03/2014. Ellasyn Swilling   Screening for gout 04/20/2023   Spondylosis 10/28/2015   Status post total replacement of left hip 02/02/2020   Unilateral primary osteoarthritis, left hip 12/27/2019   Vertigo 01/20/2021    Current Medications: Current Meds  Medication Sig   aspirin 81 MG EC tablet Take 1 tablet by mouth daily.   brimonidine (ALPHAGAN) 0.2 % ophthalmic solution Place 1 drop into both eyes 2 (two) times daily.   Calcium Carb-Cholecalciferol (OYSTER SHELL CALCIUM W/D) 500-5 MG-MCG TABS Take 1 tablet by mouth daily.   dorzolamide-timolol (COSOPT) 22.3-6.8 MG/ML ophthalmic solution Place 1 drop into both eyes 2 (two) times daily.   famotidine (PEPCID) 40 MG tablet Take 40 mg by mouth daily as needed (heartburn/indigestion.).    loratadine (CLARITIN) 10 MG tablet Take 10 mg by mouth daily.   losartan (COZAAR) 50 MG tablet Take 50 mg by mouth daily.   LUMIGAN 0.01 % SOLN Place 1 drop into both eyes at bedtime.    meclizine (ANTIVERT) 25 MG tablet Take 25 mg by mouth every 8 (eight) hours as needed for dizziness.   metoprolol succinate (TOPROL-XL) 50 MG 24 hr tablet Take 50 mg by mouth daily with supper.    ondansetron (ZOFRAN) 4 MG tablet Take 4 mg by mouth every 8 (eight) hours as needed for nausea.   pravastatin (PRAVACHOL) 20 MG tablet Take 1 tablet (20 mg total) by mouth every other day.   sertraline (ZOLOFT) 50 MG tablet Take 50 mg by mouth daily.    traZODone (DESYREL) 50 MG tablet Take 25 mg by mouth at bedtime as needed for sleep.      EKGs/Labs/Other Studies Reviewed:    The following studies were reviewed today:  EKG Interpretation Date/Time:  Thursday September 09 2023 13:32:01 EST Ventricular Rate:  77 PR Interval:  204 QRS Duration:  70 QT Interval:  372 QTC Calculation: 420 R Axis:   35  Text Interpretation: Sinus rhythm with Premature supraventricular complexes Septal infarct (cited on or before 15-Aug-2013) When compared with ECG of 15-Aug-2013 15:07,  Premature supraventricular complexes are now Present Confirmed by Norman Herrlich ([id number]) on 09/09/2023 1:33:25 PM    Physical Exam:    VS:  BP (!) 180/76   Pulse 77   Ht 5\' 4"  (1.626 m)   SpO2 96%   BMI 20.91 kg/m     Wt Readings from Last 3 Encounters:  08/21/22 121 lb 12.8 oz (55.2 kg)  06/27/21 115 lb (52.2 kg)  04/23/20 114 lb 9.6 oz (52 kg)     GEN:  Well nourished, well developed in no acute distress HEENT: Normal NECK: No JVD; No carotid bruits LYMPHATICS: No lymphadenopathy CARDIAC: She has a grade 1/6 very soft flow murmur at the aortic area no indication of significant aortic stenosis RRR, no murmurs, rubs, gallops RESPIRATORY:  Clear to auscultation without rales, wheezing or rhonchi  ABDOMEN: Soft, non-tender, non-distended MUSCULOSKELETAL:  No edema; No deformity  SKIN:  Warm and dry NEUROLOGIC:  Alert and oriented x 3 PSYCHIATRIC:  Normal affect    Signed, Norman Herrlich, MD  09/09/2023 1:57 PM    [facility]

## 2023-09-09 ENCOUNTER — Encounter: Payer: Self-pay | Admitting: Cardiology

## 2023-09-09 ENCOUNTER — Ambulatory Visit: Payer: Medicare Other | Attending: Cardiology | Admitting: Cardiology

## 2023-09-09 VITALS — BP 180/76 | HR 77 | Ht 64.0 in

## 2023-09-09 DIAGNOSIS — I110 Hypertensive heart disease with heart failure: Secondary | ICD-10-CM | POA: Diagnosis not present

## 2023-09-09 DIAGNOSIS — E782 Mixed hyperlipidemia: Secondary | ICD-10-CM | POA: Diagnosis present

## 2023-09-09 NOTE — Patient Instructions (Signed)
Medication Instructions:  Your physician recommends that you continue on your current medications as directed. Please refer to the Current Medication list given to you today.  *If you need a refill on your cardiac medications before your next appointment, please call your pharmacy*   Lab Work: None If you have labs (blood work) drawn today and your tests are completely normal, you will receive your results only by: MyChart Message (if you have MyChart) OR A paper copy in the mail If you have any lab test that is abnormal or we need to change your treatment, we will call you to review the results.   Testing/Procedures: None   Follow-Up: At [facility], you and your health needs are our priority.  As part of our continuing mission to provide you with exceptional heart care, we have created designated Provider Care Teams.  These Care Teams include your primary Cardiologist (physician) and Advanced Practice Providers (APPs -  Physician Assistants and Nurse Practitioners) who all work together to provide you with the care you need, when you need it.  We recommend signing up for the patient portal called "MyChart".  Sign up information is provided on this After Visit Summary.  MyChart is used to connect with patients for Virtual Visits (Telemedicine).  Patients are able to view lab/test results, encounter notes, upcoming appointments, etc.  Non-urgent messages can be sent to your provider as well.   To learn more about what you can do with MyChart, go to [email or web address].    Your next appointment:   1 year(s)  Provider:   Norman Herrlich, MD    Other Instructions Check and record at home blood pressures daily for 2 weeks, and after 2 weeks drop off the list of blood pressures to the office or send via MyChart

## 2024-07-10 NOTE — Progress Notes (Signed)
 " Susan Spencer is a 89 y.o.female.  Subjective:    This patient is seen today with her son.  She had felt a mass density in her right breast.  She has a known history of breast carcinoma in the left breast with mastectomy years ago but with her feeling this change she notified us  and we ordered imaging.  This came back abnormal and the patient underwent biopsy.  This biopsy was sent to me last week to contact the patient.  I spoke to her and described to her the pathology report as an invasive ductal carcinoma.  This is graded at a high level grading, though I been through this with her today that the staining for hormonal testing is still pending.  She understands.  I offered to have her come in and discuss with me or to directly refer her on to oncology.  Considering her age she chose to come and speak to me first.  Nearly this entire visit today was spent in discussion of this breast cancer diagnosis and what the options could be.  The report does read as invasive, however this may be able to be surgically removed completely.  If there is no metastases seen then surgery would often be done.  I do think she could go through that at age 70 and do well.  We discussed that if there was evidence for metastatic disease this would be much more complicated.  Surgery often would not be done without symptoms but hormonal treatment or immunotherapy or chemotherapy and/or radiation could be considered.  We went through these as well which could be complicated.  If she did a chemotherapeutic treatment I do think she would potentially have a difficult time with this.  At this point she does not want to consider anything like that.  I went through these details and gave them the opportunity to ask questions.  Plan:    Breast cancer has been diagnosed with hormonal testing still pending.  She would be interested and willing to go with surgery if there was no evidence for metastatic disease.  I have talked to her about how  for most people and she would be referred on to oncology now where her case would often be brought up to a tumor board with discussion of best options for treatment.  She understands.  At this point she would like for me to do an assessment for staging for her.  I think this is reasonable as well considering she is 89 years old and moderately disabled.  I told her that I will order staging studies and we will potentially refer her depending on what we find or where she did choose not to do anything I will help to manage her through comfort through this if she became symptomatic or had problems.  They both understand and would like to proceed this way.  Today I have evaluated and managed a chronic illness in this patient that was exacerbated, progressed, or involved side effects of their treatment.  Today I have evaluated and managed this patient with a health problem(s) considered moderate risk for morbidity and requiring further testing and/or treatment.  _____________________________________   Wants to know if it's spread.     [facility].  Diagnoses this Encounter 1. Infiltrating ductal carcinoma of right breast    (CMD)   2. Malignant neoplasm of upper-outer quadrant of right female breast, unspecified estrogen receptor status (CMD)    Chief Complaint  Patient presents with  chronic problems and med management   Patient's Medications  New Prescriptions   No medications on file  Previous Medications   ASPIRIN  81 MG EC TABLET    Take 81 mg by mouth daily.   BIMATOPROST (LUMIGAN) 0.01 % OPHTHALMIC DROPS    1 drop nightly.   BRIMONIDINE (ALPHAGAN) 0.2 % OPHTHALMIC SOLUTION    Administer 1 drop into affected eye(s) 2 (two) times a day.   DORZOLAMIDE -TIMOLOL  (COSOPT ) 22.3-6.8 MG/ML OPHTHALMIC SOLUTION    Administer 1 drop into affected eye(s).   LOSARTAN (COZAAR) 25 MG TABLET    Take 1 tablet (25 mg total) by mouth daily.   MECLIZINE (ANTIVERT) 25 MG TABLET    Take 25 mg by mouth  every 8 (eight) hours as needed for dizziness.   METOPROLOL  SUCCINATE (TOPROL  XL) 50 MG 24 HR TABLET    Take 1 tablet (50 mg total) by mouth daily.   SERTRALINE  (ZOLOFT ) 50 MG TABLET    Take 1 tablet (50 mg total) by mouth daily.   TRAZODONE (DESYREL) 50 MG TABLET    Take 0.5 tablets (25 mg total) by mouth nightly Indications: insomnia associated with depression.  Modified Medications   No medications on file  Discontinued Medications   No medications on file   Orders Placed This Encounter  Procedures   PET/CT Whole Body   Physical Exam Constitutional:      General: She is not in acute distress.    Appearance: She is not toxic-appearing or diaphoretic.     Comments: Chronically ill, elderly.  Hard of hearing.  Uses a cane. Thin.  HENT:     Head: Normocephalic and atraumatic.     Mouth/Throat:     Mouth: Mucous membranes are moist.     Pharynx: No oropharyngeal exudate.  Eyes:     General: No scleral icterus.       Right eye: No discharge.        Left eye: No discharge.  Neck:     Vascular: No carotid bruit.  Cardiovascular:     Rate and Rhythm: Normal rate and regular rhythm.     Heart sounds: No murmur heard.    No friction rub.  Pulmonary:     Effort: No respiratory distress.     Breath sounds: No stridor. No decreased breath sounds, wheezing, rhonchi or rales.  Abdominal:     General: Bowel sounds are normal.     Palpations: Abdomen is soft. There is no hepatomegaly or mass.     Tenderness: There is no abdominal tenderness. There is no rebound.  Musculoskeletal:     Cervical back: Neck supple.  Lymphadenopathy:     Cervical: No cervical adenopathy.  Skin:    General: Skin is warm and dry.     Coloration: Skin is not pale.     Findings: No rash.  Neurological:     Mental Status: She is alert and oriented to person, place, and time.     Gait: Gait normal.     _____________________ Vitals:   07/10/24 1040  BP: 130/60  Pulse: 87  Resp: 14  Temp: 97.9 F  (36.6 C)  Weight: 47.6 kg (105 lb)  Height: 1.651 m (5' 5)   Body mass index is 17.47 kg/m.  Problem List[1] Medical History[2] Surgical History[3] Family History[4] Social History   Socioeconomic History   Marital status: Married    Spouse name: Not on file   Number of children: Not on file   Years of education:  Not on file   Highest education level: Not on file  Occupational History   Not on file  Tobacco Use   Smoking status: Never   Smokeless tobacco: Never  Vaping Use   Vaping status: Never Used  Substance and Sexual Activity   Alcohol use: No   Drug use: No   Sexual activity: Not on file  Other Topics Concern   Not on file  Social History Narrative   Not on file   Social Drivers of Health   Financial Resource Strain: Not on file  Food Insecurity: Low Risk  (07/10/2024)   Food vital sign    Within the past 12 months, you worried that your food would run out before you got money to buy more: Never true    Within the past 12 months, the food you bought just didn't last and you didn't have money to get more: Never true  Transportation Needs: No Transportation Needs (07/10/2024)   Transportation    In the past 12 months, has lack of reliable transportation kept you from medical appointments, meetings, work or from getting things needed for daily living? : No  Social Connections: Not on file  Safety: Low Risk  (07/10/2024)   Safety    How often does anyone, including family and friends, physically hurt you?: Never    How often does anyone, including family and friends, insult or talk down to you?: Never    How often does anyone, including family and friends, threaten you with harm?: Never    How often does anyone, including family and friends, scream or curse at you?: Never  Living Situation: Low Risk  (07/10/2024)   Living Situation    What is your living situation today?: I have a steady place to live    Think about the place you live. Do  you have problems with any of the following? Choose all that apply:: None/None on this list   Allergies[5]  Review of Systems  Constitutional:  Negative for chills and fever.  HENT:  Negative for congestion.   Respiratory:  Negative for cough, shortness of breath and wheezing.   Cardiovascular:  Negative for chest pain and palpitations.  Gastrointestinal:  Negative for abdominal pain, constipation, diarrhea and nausea.  Genitourinary:  Negative for difficulty urinating and dysuria.    Some components of the Review of Systems, Social History, Past Medical History, Family History, and Vital Signs were taken from the patient and documented by the medical assistant assisting the provider today.  Electronically signed by: Tobias Canda Redo, CMA 07/10/2024 10:41 AM       [1] Patient Active Problem List Diagnosis   Anxiety disorder   Essential hypertension   Degeneration of lumbar intervertebral disc   Depression, major, recurrent   GERD (gastroesophageal reflux disease)   Mixed hyperlipidemia   Mitral valve disease   Occlusion and stenosis of bilateral carotid arteries   Primary osteoarthritis involving multiple joints   Osteopenia   Prediabetes   Spondylosis   PVD (peripheral vascular disease)   History of breast cancer   Vertigo   At high risk for falls   Gait abnormality   Sensorineural hearing loss (SNHL) of both ears   Pain, joint, ankle, right   Screening for gout   Overlapping toe, right   Other bursal cyst, right ankle and foot   Anterior tibialis tendonitis of left lower extremity   Pain and swelling of toe, left   Infiltrating ductal carcinoma of right breast    (  CMD)   Malignant neoplasm of upper-outer quadrant of right female breast    (CMD)  [2] Past Medical History: Diagnosis Date   Breast cancer    (CMD) 1993  [3] Past Surgical History: Procedure Laterality Date   HIP SURGERY Left    MASTECTOMY Left    OTHER  SURGICAL HISTORY     Procedure: OTHER SURGICAL HISTORY (complete colonoscopy)  [4] Family History Problem Relation Name Age of Onset   Diabetes Mother    [5] No Known Allergies "

## 2024-08-13 NOTE — Progress Notes (Unsigned)
 " [facility] [address] [place],  [place]  [address] [phone]  Clinic Day:  08/14/2024  Referring physician: Thurmond Cathlyn LABOR., MD   HISTORY OF PRESENT ILLNESS:  The patient is a 89 y.o. female who I was asked to consult upon for newly diagnosed breast cancer.  Her history dates back to June 2025 when she first noticed a mass in her upper outer right breast.  The patient claims she had a mammogram and ultrasound in early 2025 for which no abnormal findings were seen.  However, based upon this finding, a repeat mammogram and ultrasound were done, which showed the mass in question.  A biopsy of her right breast mass was done in October 2025, which revealed grade 3 invasive ductal carcinoma.  Her tumor was estrogen and progesterone receptor positive, but HER2/neu receptor negative.  Her Ki-67 score was elevated at 30%.  Of note, the patient also underwent a PET scan in December 2025, which showed 2 questionable areas of osseous metastasis.  The more apparent area was in the spinous process of her L2 vertebral body, which appeared to have an associated expansile lesion.  There also appeared to be an area of sclerosis in her left 11th costovertebral junction.  The patient comes in today to go over all of her biopsy and scan results, as well as their implications.  The patient brings to my attention that she also underwent a lumpectomy in 1993, followed by a completion mastectomy in 1994 for left breast.  As cancer was present, she received adjuvant chemotherapy, which she believes she took up to 1 year.  She also recalls undergoing adjuvant breast radiation.  She also claims that she took raloxifene  for nearly 30 years.  It was just stopped last year by her primary care office  in an attempt to streamline her extensive medication list.  To her knowledge, there is no family history of breast or ovarian cancer.  PAST MEDICAL HISTORY:   Past Medical History:  Diagnosis  Date   Anterior tibialis tendonitis of right leg 07/15/2023   Anxiety disorder 10/28/2015   At high risk for falls 02/24/2022   Breast cancer (HCC)    Carotid occlusion, bilateral 07/12/2017   left distal ICA is tortuous, increased velocity is seen     Degeneration of lumbar intervertebral disc 10/28/2015   Depression, major, recurrent 10/28/2015   Dyspnea    anxiety   Essential hypertension 06/18/2015   Functional diarrhea 06/13/2020   Gait abnormality 02/24/2022   GERD (gastroesophageal reflux disease) 10/28/2015   Hearing loss of left ear 11/03/2022   Heart murmur    High risk medication use 10/28/2015   History of breast cancer 05/05/2016   Hypertensive heart disease with heart failure (HCC) 06/18/2015   IBS (irritable bowel syndrome) 10/28/2015   Malaise and fatigue 05/05/2016   Mitral valve disease 10/28/2015   Overview:  Mild mitral regurg.   Mixed hyperlipidemia 10/28/2015   Occlusion and stenosis of bilateral carotid arteries 10/28/2015   Osteopenia 10/28/2015   Other bursal cyst, right ankle and foot 05/04/2023   Overlapping toe, right 04/20/2023   Pain and swelling of toe, left 07/15/2023   Pain, joint, ankle, right 04/20/2023   Prediabetes 10/28/2015   Primary osteoarthritis involving multiple joints 10/28/2015   PVD (peripheral vascular disease) 10/29/2015   Overview:  Carotid.  50-69 Lreft 03/2014. [place]   Screening for gout 04/20/2023   Spondylosis 10/28/2015   Status post total replacement of left  hip 02/02/2020   Unilateral primary osteoarthritis, left hip 12/27/2019   Vertigo 01/20/2021    PAST SURGICAL HISTORY:   Past Surgical History:  Procedure Laterality Date   BIOPSY BREAST Bilateral    BREAST CYST EXCISION Right    MULTIPLE   CATARACT EXTRACTION Bilateral    COLONOSCOPY     LYMPH NODE BIOPSY Left    AXILLARY   MASTECTOMY Left    TONSILLECTOMY AND ADENOIDECTOMY     TOTAL HIP ARTHROPLASTY Left 02/02/2020   Procedure: LEFT TOTAL HIP  ARTHROPLASTY ANTERIOR APPROACH;  Surgeon: Vernetta Lonni GRADE, MD;  Location: [facility];  Service: Orthopedics;  Laterality: Left;   WISDOM TOOTH EXTRACTION      CURRENT MEDICATIONS:   Current Outpatient Medications  Medication Sig Dispense Refill   aspirin  81 MG EC tablet Take 1 tablet by mouth daily.     brimonidine (ALPHAGAN) 0.2 % ophthalmic solution Place 1 drop into both eyes 2 (two) times daily.     Calcium  Carb-Cholecalciferol (OYSTER SHELL CALCIUM  W/D) 500-5 MG-MCG TABS Take 1 tablet by mouth daily.     dorzolamide -timolol  (COSOPT ) 22.3-6.8 MG/ML ophthalmic solution Place 1 drop into both eyes 2 (two) times daily.     famotidine  (PEPCID ) 40 MG tablet Take 40 mg by mouth daily as needed (heartburn/indigestion.).      loratadine  (CLARITIN ) 10 MG tablet Take 10 mg by mouth daily.     losartan (COZAAR) 50 MG tablet Take 50 mg by mouth daily.     LUMIGAN 0.01 % SOLN Place 1 drop into both eyes at bedtime.   4   meclizine (ANTIVERT) 25 MG tablet Take 25 mg by mouth every 8 (eight) hours as needed for dizziness.     metoprolol  succinate (TOPROL -XL) 50 MG 24 hr tablet Take 50 mg by mouth daily with supper.      ondansetron  (ZOFRAN ) 4 MG tablet Take 4 mg by mouth every 8 (eight) hours as needed for nausea.     pravastatin  (PRAVACHOL ) 20 MG tablet Take 1 tablet (20 mg total) by mouth every other day. 45 tablet 3   sertraline  (ZOLOFT ) 50 MG tablet Take 50 mg by mouth daily.      traZODone (DESYREL) 50 MG tablet Take 25 mg by mouth at bedtime as needed for sleep.  0   No current facility-administered medications for this visit.    ALLERGIES:  Allergies[1]  FAMILY HISTORY:   Family History  Problem Relation Age of Onset   Diabetes Mother    Heart disease Mother    Heart attack Mother    Heart attack Father    Heart disease Father     SOCIAL HISTORY:  The patient was born and raised in [place].  She currently lives in town.  She is widowed; she was previously married for  63 years.  She has 2 children and 2 grandchildren.  Of note, one of her 2 sons committed suicide.  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  There is no history of alcoholism or tobacco abuse.  REVIEW OF SYSTEMS:  Review of Systems  Constitutional:  Positive for fatigue. Negative for fever.  HENT:   Positive for hearing loss. Negative for sore throat.   Eyes:  Negative for eye problems.  Respiratory:  Negative for chest tightness, cough and hemoptysis.   Cardiovascular:  Negative for chest pain and palpitations.  Gastrointestinal:  Negative for abdominal distention, abdominal pain, blood in stool,  constipation, diarrhea, nausea and vomiting.  Endocrine: Negative for hot flashes.  Genitourinary:  Negative for difficulty urinating, dysuria, frequency, hematuria and nocturia.   Musculoskeletal:  Positive for myalgias. Negative for arthralgias, back pain and gait problem.  Skin: Negative.  Negative for itching and rash.  Neurological: Negative.  Negative for dizziness, extremity weakness, gait problem, headaches, light-headedness and numbness.  Hematological: Negative.   Psychiatric/Behavioral: Negative.  Negative for depression and suicidal ideas. The patient is not nervous/anxious.     PHYSICAL EXAM:  Blood pressure (!) 193/72, pulse 65, temperature 99.1 F (37.3 C), temperature source Oral, resp. rate 14, height 5' 4 (1.626 m), weight 104 lb 12.8 oz (47.5 kg), SpO2 100%. Wt Readings from Last 3 Encounters:  08/14/24 104 lb 12.8 oz (47.5 kg)  08/21/22 121 lb 12.8 oz (55.2 kg)  06/27/21 115 lb (52.2 kg)   Body mass index is 17.99 kg/m. Performance status (ECOG): 2 - Symptomatic, <50% confined to bed Physical Exam Constitutional:      Appearance: Normal appearance.  HENT:     Mouth/Throat:     Pharynx: Oropharynx is clear. No oropharyngeal exudate.  Cardiovascular:     Rate and Rhythm: Normal rate and regular rhythm.     Heart sounds: No  murmur heard.    No friction rub. No gallop.  Pulmonary:     Breath sounds: Normal breath sounds.  Chest:  Breasts:    Right: Mass (2-3 cm mass palpated in upper-outer quadrant) present. No swelling, bleeding, inverted nipple, nipple discharge or skin change.     Left: No swelling, bleeding, inverted nipple, mass, nipple discharge or skin change.  Abdominal:     General: Bowel sounds are normal. There is no distension.     Palpations: Abdomen is soft. There is no mass.     Tenderness: There is no abdominal tenderness.  Musculoskeletal:        General: No tenderness.     Cervical back: Normal range of motion and neck supple.     Right lower leg: No edema.     Left lower leg: No edema.  Lymphadenopathy:     Cervical: No cervical adenopathy.     Right cervical: No superficial, deep or posterior cervical adenopathy.    Left cervical: No superficial, deep or posterior cervical adenopathy.     Upper Body:     Right upper body: No supraclavicular or axillary adenopathy.     Left upper body: No supraclavicular or axillary adenopathy.     Lower Body: No right inguinal adenopathy. No left inguinal adenopathy.  Skin:    Coloration: Skin is not jaundiced.     Findings: No lesion or rash.  Neurological:     General: No focal deficit present.     Mental Status: She is alert and oriented to person, place, and time. Mental status is at baseline.  Psychiatric:        Mood and Affect: Mood normal.        Behavior: Behavior normal.        Thought Content: Thought content normal.        Judgment: Judgment normal.    STUDIES: Her PET scan on 07/19/2024 revealed the following:  IMPRESSION: Right breast mass consistent with primary breast neoplasm.  Previous left mastectomy.  No evidence of axillary, mediastinal or pulmonary metastatic disease  Negative for hepatic or adrenal bone metastatic disease .  Lesions in the left costovertebral and lumbar spinous process concerning for early bony  metastatic disease.  ASSESSMENT & PLAN:  A 89 y.o. female with hormone positive breast cancer.  In clinic today, I am went over all of her pathology and PET scan images with her, for which she understands there is the possibility that she may have early stage IV disease.  For confirmation, I will have her L2 spinous process biopsied to prove possible evidence of osseous metastasis.  As mentioned previously, this patient did have breast cancer 30 years ago.  There is a possibility that the lesion on her L2 spinous process may be late disease recurrence from that particular breast cancer.  I will arrange for this biopsy to be performed within the forthcoming weeks.  I will see her back in 3 weeks to go over her biopsy results and their implications. The patient and her son understand all the plans discussed today and are in agreement with them.  I do appreciate Thurmond Cathlyn LABOR., MD for his new consult.   Deronte Solis LABOR [place], MD           [1] No Known Allergies  "

## 2024-08-14 ENCOUNTER — Encounter: Payer: Self-pay | Admitting: Oncology

## 2024-08-14 ENCOUNTER — Inpatient Hospital Stay: Admitting: Oncology

## 2024-08-14 VITALS — BP 193/72 | HR 65 | Temp 99.1°F | Resp 14 | Ht 64.0 in | Wt 104.8 lb

## 2024-08-14 DIAGNOSIS — C50411 Malignant neoplasm of upper-outer quadrant of right female breast: Secondary | ICD-10-CM

## 2024-08-14 DIAGNOSIS — Z170 Estrogen receptor positive status [ER+]: Secondary | ICD-10-CM

## 2024-08-14 DIAGNOSIS — M899 Disorder of bone, unspecified: Secondary | ICD-10-CM

## 2024-08-14 DIAGNOSIS — C50911 Malignant neoplasm of unspecified site of right female breast: Secondary | ICD-10-CM

## 2024-08-15 DIAGNOSIS — C50411 Malignant neoplasm of upper-outer quadrant of right female breast: Secondary | ICD-10-CM | POA: Insufficient documentation

## 2024-08-15 HISTORY — DX: Malignant neoplasm of upper-outer quadrant of right female breast: C50.411

## 2024-09-01 ENCOUNTER — Inpatient Hospital Stay: Admitting: Oncology

## 2024-09-01 ENCOUNTER — Inpatient Hospital Stay

## 2024-09-01 NOTE — Progress Notes (Unsigned)
 Suttle, Ester PARAS, MD  Baldwin Channing CROME Sorry L2, not L5.       Previous Messages    ----- Message ----- From: Baldwin Channing CROME Sent: 08/31/2024  11:08 AM EST To: Channing CROME Baldwin; Taryn F Rigney, RT; Ir Proc* Subject: CT Biopsy                                      Procedure :CT Biopsy  Reason :hypermetabolic lesion L2 spinous process (PET scan 07-19-24 [facility]) Dx: Malignant neoplasm of upper-inner quadrant of right breast in female, estrogen receptor positive (HCC) [C50.211, Z17.0 (ICD-10-CM)]    History :In PACS there is images.US  BIOPSY BREAST CORE W/ IMAGING from Dec 5,2025,US  BREAST*R* from Dec 5,2025 , US  BIOPSY BREAST CORE W/ IMAGING from Dec 5,2025,MG MM DIGITAL DIAGNOSTIC UNILAT*R* W/ TOMO from Dec 5,2025,MG MM DIGITAL DIAGNOSTIC UNILAT*R* W/ TOMO from Dec 5,2025,US  BREAST*R* from Dec 5,2025,PT NM WHOLE BODY PET/CT from Dec 16,2025,MG MAMMOGRAM POST US  BIOPSY RIGHT from Dec 23,2025.   Patient ID: [id number] [facility]   Provider: Ezzard Valaria LABOR, MD  Provider contact ;  [phone]

## 2024-09-01 NOTE — Progress Notes (Unsigned)
 Jennefer Ester PARAS, MD  Baldwin Rosella L PROCEDURE / BIOPSY REVIEW Date: 09/01/24  Requested Biopsy site: L5 spinous process Reason for request: breast ca Imaging review: Best seen on PET CT [facility]) 07/25/24  Decision: Declined / Defer  Additional comments: Needs additional imaging as PET CT very poorly illustrates possible lesion.  Recommend MRI lumbar spine without and with contrast for further evaluation for biopsy candidacy.  Please contact me with questions, concerns, or if issue pertaining to this request arise.  Ester PARAS Jennefer, MD [facility]       Previous Messages    ----- Message ----- From: Baldwin Rosella CROME Sent: 08/31/2024  11:08 AM EST To: Rosella CROME Baldwin; Taryn F Rigney, RT; Ir Proc* Subject: CT Biopsy                                      Procedure :CT Biopsy  Reason :hypermetabolic lesion L2 spinous process (PET scan 07-19-24 [facility]) Dx: Malignant neoplasm of upper-inner quadrant of right breast in female, estrogen receptor positive (HCC) [C50.211, Z17.0 (ICD-10-CM)]    History :In PACS there is images.US  BIOPSY BREAST CORE W/ IMAGING from Dec 5,2025,US  BREAST*R* from Dec 5,2025 , US  BIOPSY BREAST CORE W/ IMAGING from Dec 5,2025,MG MM DIGITAL DIAGNOSTIC UNILAT*R* W/ TOMO from Dec 5,2025,MG MM DIGITAL DIAGNOSTIC UNILAT*R* W/ TOMO from Dec 5,2025,US  BREAST*R* from Dec 5,2025,PT NM WHOLE BODY PET/CT from Dec 16,2025,MG MAMMOGRAM POST US  BIOPSY RIGHT from Dec 23,2025.   Patient ID: [id number] [facility]   Provider: Ezzard Valaria LABOR, MD  Provider contact ;  [phone]

## 2024-09-04 ENCOUNTER — Inpatient Hospital Stay: Admitting: Oncology

## 2024-09-11 ENCOUNTER — Inpatient Hospital Stay: Admitting: Oncology

## 2024-09-13 NOTE — Progress Notes (Unsigned)
 " Cardiology Office Note:    Date:  09/14/2024   ID:  Susan Spencer, DOB 05/17/1930, MRN [id number]  PCP:  Thurmond Cathlyn LABOR., MD  Cardiologist:  Redell Leiter, MD    Referring MD: Thurmond Cathlyn LABOR., MD    ASSESSMENT:    1. Hypertensive heart disease with heart failure (HCC)   2. Mixed hyperlipidemia    PLAN:    In order of problems listed above:  Twombly is doing well from a cardiology perspective goal blood pressure in her age group is less than 90 systolic I do not chase a diastolic number as she can induce symptomatic hypotension these individuals continue beta-blocker ARB no longer on a diuretic She is no longer on a statin I think that is a reasonable choice in her age group And if she requires surgery for control of local cancer and encouraged her to do it I think she is an acceptable and optimized surgical candidate   Next appointment: 1 year   Medication Adjustments/Labs and Tests Ordered: Current medicines are reviewed at length with the patient today.  Concerns regarding medicines are outlined above.  Orders Placed This Encounter  Procedures   EKG 12-Lead   No orders of the defined types were placed in this encounter.    History of Present Illness:    Susan Spencer is a 89 y.o. female with a hx of hypertensive heart disease with heart failure and hyper lipidemia last seen 09/09/2023.  Recent has been seen by oncology diagnosed breast cancer grade 3 invasive ductal carcinoma  Recent results 04/21/2004 hemoglobin 13.8 platelets also normal  potassium 4.7 creatinine 0.71 GFR 80 cc/min normal liver function test cholesterol 148 LDL 76  Compliance with diet, lifestyle and medications: Yes  She is quite emotionally distressed by the diagnosis of recurrent breast cancer She is frightened of surgery I told her it is not a high risk cardiac procedure and I think she would do well and I encouraged her she needs surgery to have it done to control local disease Her previous diuretic  was discontinued when she was having trouble with vertigo She is not having edema shortness of breath chest pain palpitation or syncope Past Medical History:  Diagnosis Date   Anterior tibialis tendonitis of right leg 07/15/2023   Anxiety disorder 10/28/2015   At high risk for falls 02/24/2022   Breast cancer of upper-outer quadrant of right female breast (HCC) 08/15/2024   Carotid occlusion, bilateral 07/12/2017   left distal ICA is tortuous, increased velocity is seen     Degeneration of lumbar intervertebral disc 10/28/2015   Depression, major, recurrent 10/28/2015   Dyspnea    anxiety   Essential hypertension 06/18/2015   Functional diarrhea 06/13/2020   Gait abnormality 02/24/2022   GERD (gastroesophageal reflux disease) 10/28/2015   Hearing loss of left ear 11/03/2022   Heart murmur    High risk medication use 10/28/2015   History of breast cancer 05/05/2016   Hypertensive heart disease with heart failure (HCC) 06/18/2015   IBS (irritable bowel syndrome) 10/28/2015   Malaise and fatigue 05/05/2016   Mitral valve disease 10/28/2015   Overview:  Mild mitral regurg.   Mixed hyperlipidemia 10/28/2015   Occlusion and stenosis of bilateral carotid arteries 10/28/2015   Osteopenia 10/28/2015   Other bursal cyst, right ankle and foot 05/04/2023   Overlapping toe, right 04/20/2023   Pain and swelling of toe, left 07/15/2023   Pain, joint, ankle, right 04/20/2023   Prediabetes 10/28/2015  Primary osteoarthritis involving multiple joints 10/28/2015   PVD (peripheral vascular disease) 10/29/2015   Overview:  Carotid.  50-69 Lreft 03/2014. [facility]   Screening for gout 04/20/2023   Spondylosis 10/28/2015   Status post total replacement of left hip 02/02/2020   Unilateral primary osteoarthritis, left hip 12/27/2019   Vertigo 01/20/2021    Current Medications: Active Medications[1]    EKGs/Labs/Other Studies Reviewed:    The following studies were reviewed today:      EKG  Interpretation Date/Time:  Thursday September 14 2024 15:07:56 EST Ventricular Rate:  61 PR Interval:  196 QRS Duration:  74 QT Interval:  398 QTC Calculation: 400 R Axis:   58  Text Interpretation: Normal sinus rhythm Minimal voltage criteria for LVH, may be normal variant ( Sokolow-Lyon ) Poor R wave progression When compared with ECG of 09-Sep-2023 13:32, Premature supraventricular complexes are no longer Present Confirmed by Monetta Rogue ([id number]) on 09/14/2024 3:09:10 PM     Physical Exam:    VS:  BP (!) 160/78   Pulse 61   Ht 5' 4 (1.626 m)   Wt 107 lb (48.5 kg)   SpO2 100%   BMI 18.37 kg/m     Wt Readings from Last 3 Encounters:  09/14/24 107 lb (48.5 kg)  08/14/24 104 lb 12.8 oz (47.5 kg)  08/21/22 121 lb 12.8 oz (55.2 kg)     GEN: She looks frail well nourished, well developed in no acute distress HEENT: Normal NECK: No JVD; No carotid bruits LYMPHATICS: No lymphadenopathy CARDIAC: RRR, no murmurs, rubs, gallops RESPIRATORY:  Clear to auscultation without rales, wheezing or rhonchi  ABDOMEN: Soft, non-tender, non-distended MUSCULOSKELETAL:  No edema; No deformity  SKIN: Warm and dry NEUROLOGIC:  Alert and oriented x 3 PSYCHIATRIC:  Normal affect    Signed, Rogue Monetta, MD  09/14/2024 3:26 PM    [facility]      [1]  Current Meds  Medication Sig   aspirin  81 MG EC tablet Take 1 tablet by mouth daily.   brimonidine (ALPHAGAN) 0.2 % ophthalmic solution Place 1 drop into both eyes 2 (two) times daily.   dorzolamide -timolol  (COSOPT ) 22.3-6.8 MG/ML ophthalmic solution Place 1 drop into both eyes 2 (two) times daily.   loratadine  (CLARITIN ) 10 MG tablet Take 10 mg by mouth daily as needed.   losartan (COZAAR) 25 MG tablet Take 25 mg by mouth daily.   LUMIGAN 0.01 % SOLN Place 1 drop into both eyes at bedtime.    meclizine (ANTIVERT) 25 MG tablet Take 25 mg by mouth every 8 (eight) hours as needed for dizziness.   metoprolol  succinate  (TOPROL -XL) 50 MG 24 hr tablet Take 50 mg by mouth daily with supper.    ondansetron  (ZOFRAN ) 4 MG tablet Take 4 mg by mouth every 8 (eight) hours as needed for nausea.   sertraline  (ZOLOFT ) 50 MG tablet Take 50 mg by mouth daily.    traZODone (DESYREL) 50 MG tablet Take 25 mg by mouth at bedtime.   [DISCONTINUED] losartan (COZAAR) 50 MG tablet Take 50 mg by mouth daily.   "

## 2024-09-14 ENCOUNTER — Other Ambulatory Visit: Payer: Self-pay

## 2024-09-14 ENCOUNTER — Ambulatory Visit: Admitting: Cardiology

## 2024-09-14 ENCOUNTER — Encounter: Payer: Self-pay | Admitting: Cardiology

## 2024-09-14 VITALS — BP 150/60 | HR 61 | Ht 64.0 in | Wt 107.0 lb

## 2024-09-14 DIAGNOSIS — E782 Mixed hyperlipidemia: Secondary | ICD-10-CM | POA: Diagnosis not present

## 2024-09-14 DIAGNOSIS — I110 Hypertensive heart disease with heart failure: Secondary | ICD-10-CM | POA: Diagnosis not present

## 2024-09-14 NOTE — Progress Notes (Signed)
 The proposed treatment discussed in conference is for discussion purpose only and is not a binding recommendation.  The patients have not been physically examined, or presented with their treatment options.  Therefore, final treatment plans cannot be decided.

## 2024-09-14 NOTE — Progress Notes (Unsigned)
 " [facility] at [facility] [address] [place],  [place]  [address] [phone]  Clinic Day:  08/14/2024  Referring physician: Thurmond Cathlyn LABOR., MD   HISTORY OF PRESENT ILLNESS:  The patient is a 89 y.o. female who I was asked to consult upon for newly diagnosed breast cancer.  Her history dates back to June 2025 when she first noticed a mass in her upper outer right breast.  The patient claims she had a mammogram and ultrasound in early 2025 for which no abnormal findings were seen.  However, based upon this finding, a repeat mammogram and ultrasound were done, which showed the mass in question.  A biopsy of her right breast mass was done in October 2025, which revealed grade 3 invasive ductal carcinoma.  Her tumor was estrogen and progesterone receptor positive, but HER2/neu receptor negative.  Her Ki-67 score was elevated at 30%.  Of note, the patient also underwent a PET scan in December 2025, which showed 2 questionable areas of osseous metastasis.  The more apparent area was in the spinous process of her L2 vertebral body, which appeared to have an associated expansile lesion.  There also appeared to be an area of sclerosis in her left 11th costovertebral junction.  The patient comes in today to go over all of her biopsy and scan results, as well as their implications.  The patient brings to my attention that she also underwent a lumpectomy in 1993, followed by a completion mastectomy in 1994 for left breast.  As cancer was present, she received adjuvant chemotherapy, which she believes she took up to 1 year.  She also recalls undergoing adjuvant breast radiation.  She also claims that she took raloxifene  for nearly 30 years.  It was just stopped last year by her primary care office  in an attempt to streamline her extensive medication list.  To her knowledge, there is no family history of breast or ovarian cancer.  PAST MEDICAL HISTORY:   Past Medical History:  Diagnosis  Date   Anterior tibialis tendonitis of right leg 07/15/2023   Anxiety disorder 10/28/2015   At high risk for falls 02/24/2022   Breast cancer of upper-outer quadrant of right female breast (HCC) 08/15/2024   Carotid occlusion, bilateral 07/12/2017   left distal ICA is tortuous, increased velocity is seen     Degeneration of lumbar intervertebral disc 10/28/2015   Depression, major, recurrent 10/28/2015   Dyspnea    anxiety   Essential hypertension 06/18/2015   Functional diarrhea 06/13/2020   Gait abnormality 02/24/2022   GERD (gastroesophageal reflux disease) 10/28/2015   Hearing loss of left ear 11/03/2022   Heart murmur    High risk medication use 10/28/2015   History of breast cancer 05/05/2016   Hypertensive heart disease with heart failure (HCC) 06/18/2015   IBS (irritable bowel syndrome) 10/28/2015   Malaise and fatigue 05/05/2016   Mitral valve disease 10/28/2015   Overview:  Mild mitral regurg.   Mixed hyperlipidemia 10/28/2015   Occlusion and stenosis of bilateral carotid arteries 10/28/2015   Osteopenia 10/28/2015   Other bursal cyst, right ankle and foot 05/04/2023   Overlapping toe, right 04/20/2023   Pain and swelling of toe, left 07/15/2023   Pain, joint, ankle, right 04/20/2023   Prediabetes 10/28/2015   Primary osteoarthritis involving multiple joints 10/28/2015   PVD (peripheral vascular disease) 10/29/2015   Overview:  Carotid.  50-69 Lreft 03/2014. [place]   Screening for gout 04/20/2023   Spondylosis 10/28/2015  Status post total replacement of left hip 02/02/2020   Unilateral primary osteoarthritis, left hip 12/27/2019   Vertigo 01/20/2021    PAST SURGICAL HISTORY:   Past Surgical History:  Procedure Laterality Date   BIOPSY BREAST Bilateral    BREAST CYST EXCISION Right    MULTIPLE   CATARACT EXTRACTION Bilateral    COLONOSCOPY     LYMPH NODE BIOPSY Left    AXILLARY   MASTECTOMY Left    TONSILLECTOMY AND ADENOIDECTOMY     TOTAL HIP  ARTHROPLASTY Left 02/02/2020   Procedure: LEFT TOTAL HIP ARTHROPLASTY ANTERIOR APPROACH;  Surgeon: Vernetta Lonni GRADE, MD;  Location: [facility];  Service: Orthopedics;  Laterality: Left;   WISDOM TOOTH EXTRACTION      CURRENT MEDICATIONS:   Current Outpatient Medications  Medication Sig Dispense Refill   aspirin  81 MG EC tablet Take 1 tablet by mouth daily.     brimonidine (ALPHAGAN) 0.2 % ophthalmic solution Place 1 drop into both eyes 2 (two) times daily.     dorzolamide -timolol  (COSOPT ) 22.3-6.8 MG/ML ophthalmic solution Place 1 drop into both eyes 2 (two) times daily.     loratadine  (CLARITIN ) 10 MG tablet Take 10 mg by mouth daily as needed.     losartan (COZAAR) 25 MG tablet Take 25 mg by mouth daily.     LUMIGAN 0.01 % SOLN Place 1 drop into both eyes at bedtime.   4   meclizine (ANTIVERT) 25 MG tablet Take 25 mg by mouth every 8 (eight) hours as needed for dizziness.     metoprolol  succinate (TOPROL -XL) 50 MG 24 hr tablet Take 50 mg by mouth daily with supper.      ondansetron  (ZOFRAN ) 4 MG tablet Take 4 mg by mouth every 8 (eight) hours as needed for nausea.     sertraline  (ZOLOFT ) 50 MG tablet Take 50 mg by mouth daily.      traZODone (DESYREL) 50 MG tablet Take 25 mg by mouth at bedtime.  0   No current facility-administered medications for this visit.    ALLERGIES:  Allergies[1]  FAMILY HISTORY:   Family History  Problem Relation Age of Onset   Diabetes Mother    Heart disease Mother    Heart attack Mother    Heart attack Father    Heart disease Father     SOCIAL HISTORY:  The patient was born and raised in [place].  She currently lives in town.  She is widowed; she was previously married for 63 years.  She has 2 children and 2 grandchildren.  Of note, one of her 2 sons committed suicide.  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  There is no history of alcoholism or tobacco abuse.  REVIEW OF SYSTEMS:  Review  of Systems  Constitutional:  Positive for fatigue. Negative for fever.  HENT:   Positive for hearing loss. Negative for sore throat.   Eyes:  Negative for eye problems.  Respiratory:  Negative for chest tightness, cough and hemoptysis.   Cardiovascular:  Negative for chest pain and palpitations.  Gastrointestinal:  Negative for abdominal distention, abdominal pain, blood in stool, constipation, diarrhea, nausea and vomiting.  Endocrine: Negative for hot flashes.  Genitourinary:  Negative for difficulty urinating, dysuria, frequency, hematuria and nocturia.   Musculoskeletal:  Positive for myalgias. Negative for arthralgias, back pain and gait problem.  Skin: Negative.  Negative for itching and rash.  Neurological: Negative.  Negative for dizziness, extremity  weakness, gait problem, headaches, light-headedness and numbness.  Hematological: Negative.   Psychiatric/Behavioral: Negative.  Negative for depression and suicidal ideas. The patient is not nervous/anxious.     PHYSICAL EXAM:  There were no vitals taken for this visit. Wt Readings from Last 3 Encounters:  09/14/24 107 lb (48.5 kg)  08/14/24 104 lb 12.8 oz (47.5 kg)  08/21/22 121 lb 12.8 oz (55.2 kg)   There is no height or weight on file to calculate BMI. Performance status (ECOG): 2 - Symptomatic, <50% confined to bed Physical Exam Constitutional:      Appearance: Normal appearance.  HENT:     Mouth/Throat:     Pharynx: Oropharynx is clear. No oropharyngeal exudate.  Cardiovascular:     Rate and Rhythm: Normal rate and regular rhythm.     Heart sounds: No murmur heard.    No friction rub. No gallop.  Pulmonary:     Breath sounds: Normal breath sounds.  Chest:  Breasts:    Right: Mass (2-3 cm mass palpated in upper-outer quadrant) present. No swelling, bleeding, inverted nipple, nipple discharge or skin change.     Left: No swelling, bleeding, inverted nipple, mass, nipple discharge or skin change.  Abdominal:      General: Bowel sounds are normal. There is no distension.     Palpations: Abdomen is soft. There is no mass.     Tenderness: There is no abdominal tenderness.  Musculoskeletal:        General: No tenderness.     Cervical back: Normal range of motion and neck supple.     Right lower leg: No edema.     Left lower leg: No edema.  Lymphadenopathy:     Cervical: No cervical adenopathy.     Right cervical: No superficial, deep or posterior cervical adenopathy.    Left cervical: No superficial, deep or posterior cervical adenopathy.     Upper Body:     Right upper body: No supraclavicular or axillary adenopathy.     Left upper body: No supraclavicular or axillary adenopathy.     Lower Body: No right inguinal adenopathy. No left inguinal adenopathy.  Skin:    Coloration: Skin is not jaundiced.     Findings: No lesion or rash.  Neurological:     General: No focal deficit present.     Mental Status: She is alert and oriented to person, place, and time. Mental status is at baseline.  Psychiatric:        Mood and Affect: Mood normal.        Behavior: Behavior normal.        Thought Content: Thought content normal.        Judgment: Judgment normal.    STUDIES: Her PET scan on 07/19/2024 revealed the following:  IMPRESSION: Right breast mass consistent with primary breast neoplasm.  Previous left mastectomy.  No evidence of axillary, mediastinal or pulmonary metastatic disease  Negative for hepatic or adrenal bone metastatic disease .  Lesions in the left costovertebral and lumbar spinous process concerning for early bony metastatic disease.   ASSESSMENT & PLAN:  A 89 y.o. female with hormone positive breast cancer.  In clinic today, I am went over all of her pathology and PET scan images with her, for which she understands there is the possibility that she may have early stage IV disease.  For confirmation, I will have her L2 spinous process biopsied to prove possible evidence of osseous  metastasis.  As mentioned previously, this patient did  have breast cancer 30 years ago.  There is a possibility that the lesion on her L2 spinous process may be late disease recurrence from that particular breast cancer.  I will arrange for this biopsy to be performed within the forthcoming weeks.  I will see her back in 3 weeks to go over her biopsy results and their implications. The patient and her son understand all the plans discussed today and are in agreement with them.  I do appreciate Thurmond Cathlyn LABOR., MD for his new consult.   Warren Kugelman LABOR [place], MD            [1] No Known Allergies  "

## 2024-09-14 NOTE — Patient Instructions (Signed)

## 2024-09-15 ENCOUNTER — Inpatient Hospital Stay: Attending: Oncology | Admitting: Oncology
